# Patient Record
Sex: Male | Born: 1955
Health system: Southern US, Community
[De-identification: ages and names within clinical notes are randomized; demographics above are authoritative.]

## PROBLEM LIST (undated history)

## (undated) ENCOUNTER — Emergency Department (HOSPITAL_COMMUNITY): Payer: 59 | Source: Home / Self Care

## (undated) DIAGNOSIS — M19049 Primary osteoarthritis, unspecified hand: Secondary | ICD-10-CM

## (undated) DIAGNOSIS — N400 Enlarged prostate without lower urinary tract symptoms: Secondary | ICD-10-CM

## (undated) DIAGNOSIS — R361 Hematospermia: Secondary | ICD-10-CM

## (undated) DIAGNOSIS — R42 Dizziness and giddiness: Secondary | ICD-10-CM

## (undated) DIAGNOSIS — Z524 Kidney donor: Secondary | ICD-10-CM

## (undated) DIAGNOSIS — K635 Polyp of colon: Secondary | ICD-10-CM

## (undated) DIAGNOSIS — K219 Gastro-esophageal reflux disease without esophagitis: Secondary | ICD-10-CM

## (undated) DIAGNOSIS — J302 Other seasonal allergic rhinitis: Secondary | ICD-10-CM

## (undated) DIAGNOSIS — E7800 Pure hypercholesterolemia, unspecified: Secondary | ICD-10-CM

## (undated) DIAGNOSIS — I10 Essential (primary) hypertension: Secondary | ICD-10-CM

## (undated) DIAGNOSIS — N529 Male erectile dysfunction, unspecified: Secondary | ICD-10-CM

## (undated) HISTORY — DX: Benign prostatic hyperplasia without lower urinary tract symptoms: N40.0

## (undated) HISTORY — DX: Primary osteoarthritis, unspecified hand: M19.049

## (undated) HISTORY — DX: Other seasonal allergic rhinitis: J30.2

## (undated) HISTORY — DX: Hematospermia: R36.1

## (undated) HISTORY — DX: Gastro-esophageal reflux disease without esophagitis: K21.9

## (undated) HISTORY — PX: ROTATOR CUFF REPAIR: SHX139

## (undated) HISTORY — PX: OTHER SURGICAL HISTORY: SHX169

## (undated) HISTORY — PX: KIDNEY DONATION: SHX685

## (undated) HISTORY — DX: Male erectile dysfunction, unspecified: N52.9

## (undated) HISTORY — DX: Essential (primary) hypertension: I10

## (undated) HISTORY — DX: Polyp of colon: K63.5

---

## 1998-05-15 ENCOUNTER — Ambulatory Visit (HOSPITAL_COMMUNITY): Admission: RE | Admit: 1998-05-15 | Discharge: 1998-05-15 | Payer: Self-pay | Admitting: Gastroenterology

## 1998-08-23 ENCOUNTER — Emergency Department (HOSPITAL_COMMUNITY): Admission: EM | Admit: 1998-08-23 | Discharge: 1998-08-23 | Payer: Self-pay | Admitting: Emergency Medicine

## 1998-08-23 ENCOUNTER — Encounter: Payer: Self-pay | Admitting: Emergency Medicine

## 1999-01-06 ENCOUNTER — Emergency Department (HOSPITAL_COMMUNITY): Admission: EM | Admit: 1999-01-06 | Discharge: 1999-01-06 | Payer: Self-pay | Admitting: Emergency Medicine

## 1999-07-09 ENCOUNTER — Ambulatory Visit (HOSPITAL_COMMUNITY): Admission: RE | Admit: 1999-07-09 | Discharge: 1999-07-09 | Payer: Self-pay | Admitting: Gastroenterology

## 1999-07-09 ENCOUNTER — Encounter (INDEPENDENT_AMBULATORY_CARE_PROVIDER_SITE_OTHER): Payer: Self-pay | Admitting: Specialist

## 2003-12-13 ENCOUNTER — Ambulatory Visit (HOSPITAL_BASED_OUTPATIENT_CLINIC_OR_DEPARTMENT_OTHER): Admission: RE | Admit: 2003-12-13 | Discharge: 2003-12-13 | Payer: Self-pay | Admitting: Emergency Medicine

## 2005-05-04 ENCOUNTER — Encounter: Admission: RE | Admit: 2005-05-04 | Discharge: 2005-05-04 | Payer: Self-pay | Admitting: Emergency Medicine

## 2005-05-07 ENCOUNTER — Encounter: Admission: RE | Admit: 2005-05-07 | Discharge: 2005-05-07 | Payer: Self-pay | Admitting: Emergency Medicine

## 2009-04-17 ENCOUNTER — Encounter: Admission: RE | Admit: 2009-04-17 | Discharge: 2009-04-17 | Payer: Self-pay | Admitting: Occupational Medicine

## 2009-06-18 ENCOUNTER — Ambulatory Visit (HOSPITAL_BASED_OUTPATIENT_CLINIC_OR_DEPARTMENT_OTHER): Admission: RE | Admit: 2009-06-18 | Discharge: 2009-06-18 | Payer: Self-pay | Admitting: Orthopedic Surgery

## 2010-09-30 ENCOUNTER — Other Ambulatory Visit: Payer: Self-pay | Admitting: Gastroenterology

## 2010-10-15 ENCOUNTER — Ambulatory Visit
Admission: RE | Admit: 2010-10-15 | Discharge: 2010-10-15 | Disposition: A | Discharge status: Home / Self Care | Payer: 59 | Source: Ambulatory Visit | Attending: Gastroenterology | Admitting: Gastroenterology

## 2010-11-27 LAB — POCT HEMOGLOBIN-HEMACUE: Hemoglobin: 15.1 g/dL (ref 13.0–17.0)

## 2012-08-09 ENCOUNTER — Ambulatory Visit
Admission: RE | Admit: 2012-08-09 | Discharge: 2012-08-09 | Disposition: A | Discharge status: Home / Self Care | Payer: No Typology Code available for payment source | Source: Ambulatory Visit | Attending: Occupational Medicine | Admitting: Occupational Medicine

## 2012-08-09 ENCOUNTER — Other Ambulatory Visit: Payer: Self-pay | Admitting: Occupational Medicine

## 2012-08-09 DIAGNOSIS — Z0000 Encounter for general adult medical examination without abnormal findings: Secondary | ICD-10-CM

## 2013-07-28 ENCOUNTER — Encounter (HOSPITAL_COMMUNITY): Payer: Self-pay | Admitting: Emergency Medicine

## 2013-07-28 ENCOUNTER — Emergency Department (HOSPITAL_COMMUNITY)
Admission: EM | Admit: 2013-07-28 | Discharge: 2013-07-28 | Disposition: A | Discharge status: Home / Self Care | Payer: 59 | Attending: Emergency Medicine | Admitting: Emergency Medicine

## 2013-07-28 ENCOUNTER — Emergency Department (HOSPITAL_COMMUNITY): Payer: 59

## 2013-07-28 DIAGNOSIS — Z8719 Personal history of other diseases of the digestive system: Secondary | ICD-10-CM | POA: Insufficient documentation

## 2013-07-28 DIAGNOSIS — Z862 Personal history of diseases of the blood and blood-forming organs and certain disorders involving the immune mechanism: Secondary | ICD-10-CM | POA: Insufficient documentation

## 2013-07-28 DIAGNOSIS — R079 Chest pain, unspecified: Secondary | ICD-10-CM

## 2013-07-28 DIAGNOSIS — R0789 Other chest pain: Secondary | ICD-10-CM | POA: Insufficient documentation

## 2013-07-28 DIAGNOSIS — Z8639 Personal history of other endocrine, nutritional and metabolic disease: Secondary | ICD-10-CM | POA: Insufficient documentation

## 2013-07-28 HISTORY — DX: Pure hypercholesterolemia, unspecified: E78.00

## 2013-07-28 HISTORY — DX: Gastro-esophageal reflux disease without esophagitis: K21.9

## 2013-07-28 LAB — BASIC METABOLIC PANEL
Chloride: 99 mEq/L (ref 96–112)
Creatinine, Ser: 1.65 mg/dL — ABNORMAL HIGH (ref 0.50–1.35)
GFR calc Af Amer: 52 mL/min — ABNORMAL LOW (ref >90)
Potassium: 4.1 mEq/L (ref 3.5–5.1)

## 2013-07-28 LAB — CBC
Platelets: 227 10*3/uL (ref 150–400)
RDW: 13.7 % (ref 11.5–15.5)
WBC: 6.9 10*3/uL (ref 4.0–10.5)

## 2013-07-28 LAB — POCT I-STAT TROPONIN I
Troponin i, poc: 0.01 ng/mL (ref 0.00–0.08)
Troponin i, poc: 0.01 ng/mL (ref 0.00–0.08)

## 2013-07-28 NOTE — ED Notes (Signed)
Pt comfortable with d/c and f/u instructions. No Prescriptions. 

## 2013-07-28 NOTE — ED Provider Notes (Signed)
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 He reports episode lasted for approximately 2 minutes and then spontaneously resolved. He no radiation of pain. No nausea, dizziness, diaphoresis. He states he went to the gym today and again in tensor get training for her one hour and had no chest pain or shortness of breath during this period he reports that he's been working out 5 days a week for the past 15 years without symptoms. He recently retired from the [employer]. 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 Denies a history of diabetes, hypertension, tobacco use or family history of premature CAD. No history of PE or DVT. No history of lower extremity swelling or pain. No recent prolonged immobilization such as hospitalization or long flights, surgery, trauma, fracture. No recent fever or cough. He states he is now feeling "great".  Primary care physician is at [facility]  ROS: See HPI Constitutional: no fever  Eyes: no drainage  ENT: no runny nose   Cardiovascular:  chest pain  Resp: SOB  GI: no vomiting GU: no dysuria Integumentary: no rash  Allergy: no hives  Musculoskeletal: no leg swelling  Neurological: no slurred speech ROS otherwise negative  PAST MEDICAL HISTORY/PAST SURGICAL HISTORY:  Past Medical History  Diagnosis Date  . Acid reflux   . High cholesterol     MEDICATIONS:  Prior to Admission medications   Not on File    ALLERGIES:  No Known Allergies  SOCIAL HISTORY:  History  Substance Use Topics  . Smoking status: Never Smoker   . Smokeless tobacco: Not on file  .  Alcohol Use: Yes    FAMILY HISTORY: History reviewed. No pertinent family history.  EXAM: BP 160/79  Pulse 57  Temp(Src) 97.5 F (36.4 C) (Oral)  Resp 18  Ht 5\' 10"  (1.778 m)  Wt 198 lb (89.812 kg)  BMI 28.41 kg/m2  SpO2 100% CONSTITUTIONAL: Alert and oriented and responds appropriately to questions. Well-appearing; well-nourished HEAD: Normocephalic EYES: Conjunctivae clear, PERRL ENT: normal nose; no rhinorrhea; moist mucous membranes; pharynx without lesions noted NECK: Supple, no meningismus, no LAD  CARD: RRR; S1 and S2 appreciated; no murmurs, no clicks, no rubs, no gallops RESP: Normal chest excursion without splinting or tachypnea; breath sounds clear and equal bilaterally; no wheezes, no rhonchi, no rales, chest wall is nontender to palpation ABD/GI: Normal bowel sounds; non-distended; soft, non-tender, no rebound, no guarding BACK:  The back appears normal and is non-tender to palpation, there is no CVA tenderness EXT: Normal ROM in all joints; non-tender to palpation; no edema; normal capillary refill; no cyanosis    SKIN: Normal color for age and race; warm NEURO: Moves all extremities equally PSYCH: The patient's mood and manner are appropriate. Grooming and personal hygiene are appropriate.  MEDICAL DECISION MAKING: Patient here with an episode of chest tightness and shortness of breath that started while at rest. He reports that he was getting repeat when his symptoms started. He denies that this feels like his prior episodes of acid reflux. He has never had similar symptoms. Patient is otherwise healthy with minimal risk factors for ACS and is PERC  negative.  His EKG is normal and his first troponin is negative. He states his symptoms today started at 2:30 PM.  We'll repeat second troponin at 6 hours after the onset of symptoms. Chest x-ray is clear. Patient agrees with this plan. Have advised him to followup with his primary care physician next week.  ED PROGRESS:  Patient's repeat troponin greater than 6 hours after the onset of symptoms is negative. He still hemodynamically stable and asymptomatic. Will have him followup with his primary care physician next week. Given return precautions. Patient verbalizes understanding is comfortable plan.   EKG Interpretation    Date/Time:  Friday July 28 2013 14:46:30 EST Ventricular Rate:  64 PR Interval:  190 QRS Duration: 84 QT Interval:  422 QTC Calculation: 435 R Axis:   49 Text Interpretation:  Normal sinus rhythm Normal ECG No significant change since last tracing Confirmed by Athea Haley  DO, Kennethia Lynes ([id number]) on 07/28/2013 3:24:55 PM             Layla Maw Ndeye Tenorio, DO 07/28/13 2133

## 2013-07-28 NOTE — ED Notes (Signed)
Pt states he went to gym, worked out, left, cooled down, went and got a haircut. Went home, sat down to eat, got tightness in chest, felt SOB. Episode lasted a couple of minutes. Pt denies currently feeling chest pain or SOB. Was concerned due to never having this happen before, states he has a hiatal hernia and pain was different from that. Denies nausea, lightheaded, sweaty.

## 2013-07-28 NOTE — ED Notes (Signed)
Per pt sts that he went home after working out and started having chest tightness and SOB. sts no pain now but he just doesn't feel right.

## 2013-07-28 NOTE — ED Notes (Signed)
The pt has had some chest tightness and some sob whenever he was getting ready for lunch approx 1330.  He had been to the gym earlier  But had no chest tightness then.  At present no chest tightness no sob.  No previous history.  No leg pain no car or plane trips.  Alert oriented skin warm and dry no distress

## 2015-03-04 ENCOUNTER — Ambulatory Visit (INDEPENDENT_AMBULATORY_CARE_PROVIDER_SITE_OTHER): Payer: 59

## 2015-03-04 ENCOUNTER — Encounter: Payer: Self-pay | Admitting: Podiatry

## 2015-03-04 ENCOUNTER — Ambulatory Visit (INDEPENDENT_AMBULATORY_CARE_PROVIDER_SITE_OTHER): Payer: 59 | Admitting: Podiatry

## 2015-03-04 VITALS — BP 126/74 | HR 55 | Resp 17

## 2015-03-04 DIAGNOSIS — S99922A Unspecified injury of left foot, initial encounter: Secondary | ICD-10-CM

## 2015-03-04 DIAGNOSIS — M779 Enthesopathy, unspecified: Secondary | ICD-10-CM

## 2015-03-04 DIAGNOSIS — S8992XA Unspecified injury of left lower leg, initial encounter: Secondary | ICD-10-CM

## 2015-03-04 MED ORDER — METHYLPREDNISOLONE 4 MG PO TBPK: ORAL_TABLET | ORAL | Status: DC

## 2015-03-04 NOTE — Progress Notes (Signed)
   Subjective:    Patient ID: [employer]OL [employer], male    DOB: 1956-02-05, 59 y.o.   MRN: [id number]  HPI 59 year old male presents the office as concerns her left foot pain on the ball the foot. He states that he was running on the beach last week about 2 miles a day. After running the beach started to have pain to his foot. Prior to this he was running however on hard surfaces. He states that over the weekend he was having a lot of pain to his foot and he is having difficulty been pressure down. He points to the sulcus of the second toe just distal to the MTPJ with the majority of his pain is localized. He also states he has pain returned been the second toe upwards. He said no prior treatment. He denies any specific injury or trauma. No other complaints at this time.   Review of Systems  All other systems reviewed and are negative.      Objective:   Physical Exam AAO x3, NAD DP/PT pulses palpable bilaterally, CRT less than 3 seconds Protective sensation intact with Simms Weinstein monofilament, vibratory sensation intact, Achilles tendon reflex intact There is tender to palpation along the sulcus of the left second digit just distal to the MTPJ. There is localized edema to this area as well. There is pain upon dorsal subluxation of the second MTPJ hallux does not appear to be any gross subluxation or dislocation compared to the contralateral extremity at this time. This is difficult to evaluate this time due to pain. There is no area pinpoint tenderness along the metatarsals or to the digit. There is no other areas of tenderness to bilateral lower x-rays. No other areas of edema, erythema, increase in warmth. MMT 5/5, ROM WNL.  No open lesions or pre-ulcerative lesions.  No pain with calf compression, swelling, warmth, erythema bilaterally.      Assessment & Plan:  59 year old male left foot pain, likely second plantar plate injury. -X-rays were obtained and reviewed with the patient. No  definitive evidence of fracture or stress fracture at this time.  -Treatment options discussed including all alternatives, risks, and complications -Discussed likely etiology of his symptoms. -Showed the patient had tape the toe down. -Dispensed offloading pads. -Prescribed Medrol Dosepak. Discussed side effects the medication directed. If any are to occur. He states he has had this medication before without any problems. -If this does not seem to help bleeding symptoms will place and surgical shoe. -Follow-up 3 weeks or sooner if any problems arise. In the meantime, encouraged to call the office with any questions, concerns, change in symptoms.   Celesta Gentile, DPM

## 2015-03-05 ENCOUNTER — Encounter: Payer: Self-pay | Admitting: Podiatry

## 2015-03-05 DIAGNOSIS — S99929A Unspecified injury of unspecified foot, initial encounter: Secondary | ICD-10-CM | POA: Insufficient documentation

## 2015-03-25 ENCOUNTER — Encounter: Payer: Self-pay | Admitting: Podiatry

## 2015-03-25 ENCOUNTER — Ambulatory Visit (INDEPENDENT_AMBULATORY_CARE_PROVIDER_SITE_OTHER): Payer: 59 | Admitting: Podiatry

## 2015-03-25 VITALS — BP 140/77 | HR 64 | Resp 12

## 2015-03-25 DIAGNOSIS — S8992XA Unspecified injury of left lower leg, initial encounter: Secondary | ICD-10-CM

## 2015-03-25 DIAGNOSIS — S99922A Unspecified injury of left foot, initial encounter: Secondary | ICD-10-CM

## 2015-03-25 NOTE — Progress Notes (Signed)
Patient ID: Ruben Leonard, male   DOB: 1956-05-03, 59 y.o.   MRN: [id number]  Subjective: 59 year old male presents to the office for follow up evaluation of left foot pain. He states that he is finished his course of Medrol Dosepak he states overall the left foot is doing much better. He does continue to have some slight discomfort with deep pressure directly overlying the area however it is much improved. He does believe that this pain started after wearing a new Nike shoe and he typically wears Reeboks and a stiffer style shoe. He denies any swelling or redness over the area. Denies any acute changes since last appointment and no other complaints at this time.  Objective: AAO 3, NAD Neurovascular status unchanged There is significant improvement tenderness to palpation of the sulcus of left second digit. There is slight discomfort upon deep palpation of the area. There is no overlying edema, erythema, increase in warmth. There is no sign of the discomfort upon dorsal subluxation of the second MTPJ and there is no significant dislocation compared to the contralateral extremity. There is no area pinpoint bony tenderness or pain the vibratory sensation. No open lesions or pre-ulcerative lesions. There is no pain with calf compression, swelling, warmth, erythema.  Assessment: 59 year old male with resolving left foot pain, likely plantar plate injury  Plan: -Treatment options discussed including all alternatives, risks, and complications -Discussed orthotics to help support and offload the synthetic area. Also discussed with him of warts stiff styled shoe in order to help alleviate his symptoms. He states he recently would does wear stiffer shoe when change in shoe gear as when he noticed this pain. Continue metatarsal pad as he states this significantly helped offload the area as well. Discussed custom orthotics. -Follow-up if symptoms not completely resolved in 4 weeks or sooner if any problems  arise. In the meantime, encouraged to call the office with any questions, concerns, change in symptoms.   Celesta Gentile, DPM

## 2015-04-15 ENCOUNTER — Emergency Department (HOSPITAL_COMMUNITY): Payer: 59

## 2015-04-15 ENCOUNTER — Encounter (HOSPITAL_COMMUNITY): Payer: Self-pay | Admitting: *Deleted

## 2015-04-15 ENCOUNTER — Emergency Department (HOSPITAL_COMMUNITY)
Admission: EM | Admit: 2015-04-15 | Discharge: 2015-04-15 | Disposition: A | Discharge status: Home / Self Care | Payer: 59 | Attending: Emergency Medicine | Admitting: Emergency Medicine

## 2015-04-15 DIAGNOSIS — Y9389 Activity, other specified: Secondary | ICD-10-CM | POA: Diagnosis not present

## 2015-04-15 DIAGNOSIS — Y9289 Other specified places as the place of occurrence of the external cause: Secondary | ICD-10-CM | POA: Diagnosis not present

## 2015-04-15 DIAGNOSIS — S0083XA Contusion of other part of head, initial encounter: Secondary | ICD-10-CM | POA: Diagnosis not present

## 2015-04-15 DIAGNOSIS — Z79899 Other long term (current) drug therapy: Secondary | ICD-10-CM | POA: Diagnosis not present

## 2015-04-15 DIAGNOSIS — W500XXA Accidental hit or strike by another person, initial encounter: Secondary | ICD-10-CM | POA: Diagnosis not present

## 2015-04-15 DIAGNOSIS — S0993XA Unspecified injury of face, initial encounter: Secondary | ICD-10-CM | POA: Diagnosis present

## 2015-04-15 DIAGNOSIS — K219 Gastro-esophageal reflux disease without esophagitis: Secondary | ICD-10-CM | POA: Diagnosis not present

## 2015-04-15 DIAGNOSIS — R52 Pain, unspecified: Secondary | ICD-10-CM

## 2015-04-15 DIAGNOSIS — Y998 Other external cause status: Secondary | ICD-10-CM | POA: Diagnosis not present

## 2015-04-15 DIAGNOSIS — T148XXA Other injury of unspecified body region, initial encounter: Secondary | ICD-10-CM

## 2015-04-15 DIAGNOSIS — E782 Mixed hyperlipidemia: Secondary | ICD-10-CM | POA: Diagnosis not present

## 2015-04-15 MED ORDER — TRAMADOL HCL 50 MG PO TABS: 50.0000 mg | ORAL_TABLET | Freq: Four times a day (QID) | ORAL | Status: DC | PRN

## 2015-04-15 NOTE — ED Provider Notes (Signed)
CSN: [id number]     Arrival date & time 04/15/15  1343 History   First MD Initiated Contact with Patient 04/15/15 1600     Chief Complaint  Patient presents with  . Jaw Pain     (Consider location/radiation/quality/duration/timing/severity/associated sxs/prior Treatment) HPI Comments: Pt comes in with jaw pain and dizziness. Dizziness has resolved after lasting intermittently for 2  Hours. Pt reports that he was working out, lost control of the pull down bar and the bar snapped and hit him in his jaw. He almost felt like he was going to be knocked out, and then started feeling dizzy. PT has jaw pain, especially when he moves it laterally. No headache. No nausea, vomiting, visual complains, seizures, altered mental status, loss of consciousness, new weakness, or numbness, no gait instability.  The history is provided by the patient.    Past Medical History  Diagnosis Date  . Acid reflux   . High cholesterol    Past Surgical History  Procedure Laterality Date  . Kidney donation     No family history on file. Social History  Substance Use Topics  . Smoking status: Never Smoker   . Smokeless tobacco: Never Used  . Alcohol Use: 0.0 oz/week    0 Standard drinks or equivalent per week    Review of Systems  Eyes: Negative for visual disturbance.  Respiratory: Negative for cough.   Genitourinary: Negative for dysuria, enuresis and difficulty urinating.  Musculoskeletal: Positive for arthralgias. Negative for neck pain and neck stiffness.  Neurological: Positive for dizziness. Negative for light-headedness and headaches.  Hematological: Does not bruise/bleed easily.  Psychiatric/Behavioral: Negative for confusion.      Allergies  Review of patient's allergies indicates no known allergies.  Home Medications   Prior to Admission medications   Medication Sig Start Date End Date Taking? Authorizing Provider  atorvastatin (LIPITOR) 20 MG tablet Take 20 mg by mouth daily.     Historical Provider, MD  CIALIS 20 MG tablet Take 20 mg by mouth as needed. 01/29/15   Historical Provider, MD  omeprazole (PRILOSEC) 20 MG capsule Take 20 mg by mouth daily.    Historical Provider, MD  traMADol (ULTRAM) 50 MG tablet Take 1 tablet (50 mg total) by mouth every 6 (six) hours as needed for severe pain. 04/15/15   Nancie Bocanegra Kathrynn Humble, MD   BP 143/75 mmHg  Pulse 62  Temp(Src) 98.7 F (37.1 C) (Oral)  Resp 20  SpO2 96% Physical Exam  Constitutional: He is oriented to person, place, and time. He appears well-developed.  HENT:  Head: Atraumatic.  Stable mandible, no trismus  Neck: Neck supple.  Cardiovascular: Normal rate.   Pulmonary/Chest: Effort normal.  Neurological: He is alert and oriented to person, place, and time. No cranial nerve deficit. Coordination normal.  Skin: Skin is warm.  Nursing note and vitals reviewed.   ED Course  Procedures (including critical care time) Labs Review Labs Reviewed - No data to display  Imaging Review Dg Orthopantogram  04/15/2015   CLINICAL DATA:  Patient was hit in the chin with a barbell. Complains of bilateral temporomandibular joint pain  EXAM: ORTHOPANTOGRAM/PANORAMIC  COMPARISON:  None.  FINDINGS: Bilateral temporomandibular joints are normally located without evidence of osteoarthritic changes. There is no evidence of osseous lesions or fractures. Normal dentition is seen. There are unerupted horizontally oriented within the bone mandibular wisdom teeth bilaterally.  IMPRESSION: No evidence of fracture or dislocation of the temporomandibular joints.  Unerupted impacted mandibular wisdom teeth.   Electronically  Signed   By: Fidela Salisbury M.D.   On: 04/15/2015 14:55   I have personally reviewed and evaluated these images and lab results as part of my medical decision-making.   EKG Interpretation None      MDM   Final diagnoses:  Contusion    Pt with blunt trauma to the chin and resultant jaw pain. Xrays neg for  fracture. No dizziness, no headaches - doubt posterior circulation injury or concussion. Soft diet and rice tx advised.   Varney Biles, MD 04/15/15 [phone]

## 2015-04-15 NOTE — Discharge Instructions (Signed)
Xrays are normal. Dizziness has resolved - which is good. Try soft diet for the next 1-2 days. Expect pain to get worse later. Return to the  ER if you have sever dizziness, confusion.   Contusion A contusion is a deep bruise. Contusions are the result of an injury that caused bleeding under the skin. The contusion may turn blue, purple, or yellow. Minor injuries will give you a painless contusion, but more severe contusions may stay painful and swollen for a few weeks.  CAUSES  A contusion is usually caused by a blow, trauma, or direct force to an area of the body. SYMPTOMS   Swelling and redness of the injured area.  Bruising of the injured area.  Tenderness and soreness of the injured area.  Pain. DIAGNOSIS  The diagnosis can be made by taking a history and physical exam. An X-ray, CT scan, or MRI may be needed to determine if there were any associated injuries, such as fractures. TREATMENT  Specific treatment will depend on what area of the body was injured. In general, the best treatment for a contusion is resting, icing, elevating, and applying cold compresses to the injured area. Over-the-counter medicines may also be recommended for pain control. Ask your caregiver what the best treatment is for your contusion. HOME CARE INSTRUCTIONS   Put ice on the injured area.  Put ice in a plastic bag.  Place a towel between your skin and the bag.  Leave the ice on for 15-20 minutes, 3-4 times a day, or as directed by your health care provider.  Only take over-the-counter or prescription medicines for pain, discomfort, or fever as directed by your caregiver. Your caregiver may recommend avoiding anti-inflammatory medicines (aspirin, ibuprofen, and naproxen) for 48 hours because these medicines may increase bruising.  Rest the injured area.  If possible, elevate the injured area to reduce swelling. SEEK IMMEDIATE MEDICAL CARE IF:   You have increased bruising or swelling.  You  have pain that is getting worse.  Your swelling or pain is not relieved with medicines. MAKE SURE YOU:   Understand these instructions.  Will watch your condition.  Will get help right away if you are not doing well or get worse. Document Released: 05/20/2005 Document Revised: 08/15/2013 Document Reviewed: 06/15/2011 [facility] Patient Information 2015 [place], [place]. This information is not intended to replace advice given to you by your health care provider. Make sure you discuss any questions you have with your health care provider.

## 2015-04-15 NOTE — ED Notes (Signed)
Patient reports he was working out.  He states the weights slipped from his hands and hit him under his chin.  States it almost knocked him out.  Patient has some pain when movement of jaw. Patient states he feels dizzy as well.   Patient denies n/v.  Patient denies neck pain.  Patient took tylenol at 1100.

## 2015-04-15 NOTE — ED Notes (Signed)
Declined W/C at D/C and was escorted to lobby by RN. 

## 2015-04-26 ENCOUNTER — Telehealth: Payer: Self-pay

## 2015-04-26 ENCOUNTER — Other Ambulatory Visit: Payer: Self-pay | Admitting: Podiatry

## 2015-04-26 MED ORDER — TRAMADOL HCL 50 MG PO TABS: 50.0000 mg | ORAL_TABLET | Freq: Four times a day (QID) | ORAL | Status: DC | PRN

## 2015-04-26 NOTE — Telephone Encounter (Signed)
No refill

## 2015-04-26 NOTE — Telephone Encounter (Signed)
Dr. Jacqualyn Posey, can this medication be refilled?  Marcy Siren

## 2015-04-26 NOTE — Telephone Encounter (Signed)
Spoke with pt regarding foot pain. He states he think he re-injured the foot again. Advised him to ice, elevate, and get an appt to be evaluated. In the mean time he can use tramadol to help with the pain as pt states this helped before. Tramadol Rx written and faxed to pharmacy

## 2015-07-26 ENCOUNTER — Ambulatory Visit (INDEPENDENT_AMBULATORY_CARE_PROVIDER_SITE_OTHER): Payer: 59 | Admitting: Podiatry

## 2015-07-26 ENCOUNTER — Encounter: Payer: Self-pay | Admitting: Podiatry

## 2015-07-26 VITALS — BP 137/80 | HR 62 | Resp 12

## 2015-07-26 DIAGNOSIS — B351 Tinea unguium: Secondary | ICD-10-CM | POA: Diagnosis not present

## 2015-07-26 NOTE — Progress Notes (Signed)
   Subjective:    Patient ID: Ruben Leonard, male    DOB: 08-14-1956, 59 y.o.   MRN: [id number]  HPI  59 year old male presents the also concerns a right big toe nail discoloration and thickening which is ongoing for about 2 months and his been progressive in looking worse. He's had no previous treatment. Denies any pain to the area and denies any surrounding redness or drainage. No other complaints at this time.  Review of Systems  Skin: Positive for color change.       Objective:   Physical Exam AAO 3, NAD DP/PT pulses palpable 2/4, CRT less than 3 seconds Protective sensation intact with Derrel Nip monofilament Right hallux toe nail is hypertrophic, dystrophic, discolored , brittle. There is no surrounding edema, erythema, drainage/purulence. Remainder the toenails are somewhat dystrophic and discolored but not as significant as the right hallux. No open lesions or pre-ulcerative lesions. No areas of tenderness bilateral lower extremities. There is no pain with Compression, swelling, warmth, erythema.     Assessment & Plan:   59 year old male with right hallux onychodystrophy, likely onychomycosis -Treatment options discussed including all alternatives, risks, and complications -The nail was biopsied and sent for culture. Discussed treatment options for onychomycosis but we will await the results of the biopsy before proceeding with treatment. Follow-up after nail biopsy or sooner if any problems are to arise. Call with questions or concerns.  Celesta Gentile, DPM

## 2015-07-26 NOTE — Patient Instructions (Signed)

## 2015-08-02 ENCOUNTER — Encounter: Payer: Self-pay | Admitting: Podiatry

## 2015-08-12 ENCOUNTER — Telehealth: Payer: Self-pay | Admitting: *Deleted

## 2015-08-12 NOTE — Telephone Encounter (Signed)
Dr. Jacqualyn Posey reviewed fungal culture results as negative and recommended urea cream-[id number], or Nuvail.  Results to pt and recommendations.

## 2015-08-29 DIAGNOSIS — B351 Tinea unguium: Secondary | ICD-10-CM

## 2015-09-13 ENCOUNTER — Ambulatory Visit: Payer: 59 | Admitting: Podiatry

## 2015-11-07 IMAGING — CR DG CHEST 2V
2 series · 2 of 2 positions shown · non-contrast
Comparison: 08/09/2012

CLINICAL DATA: Chest tightness, shortness of breath

EXAM:
CHEST  2 VIEW

[w chest pa]
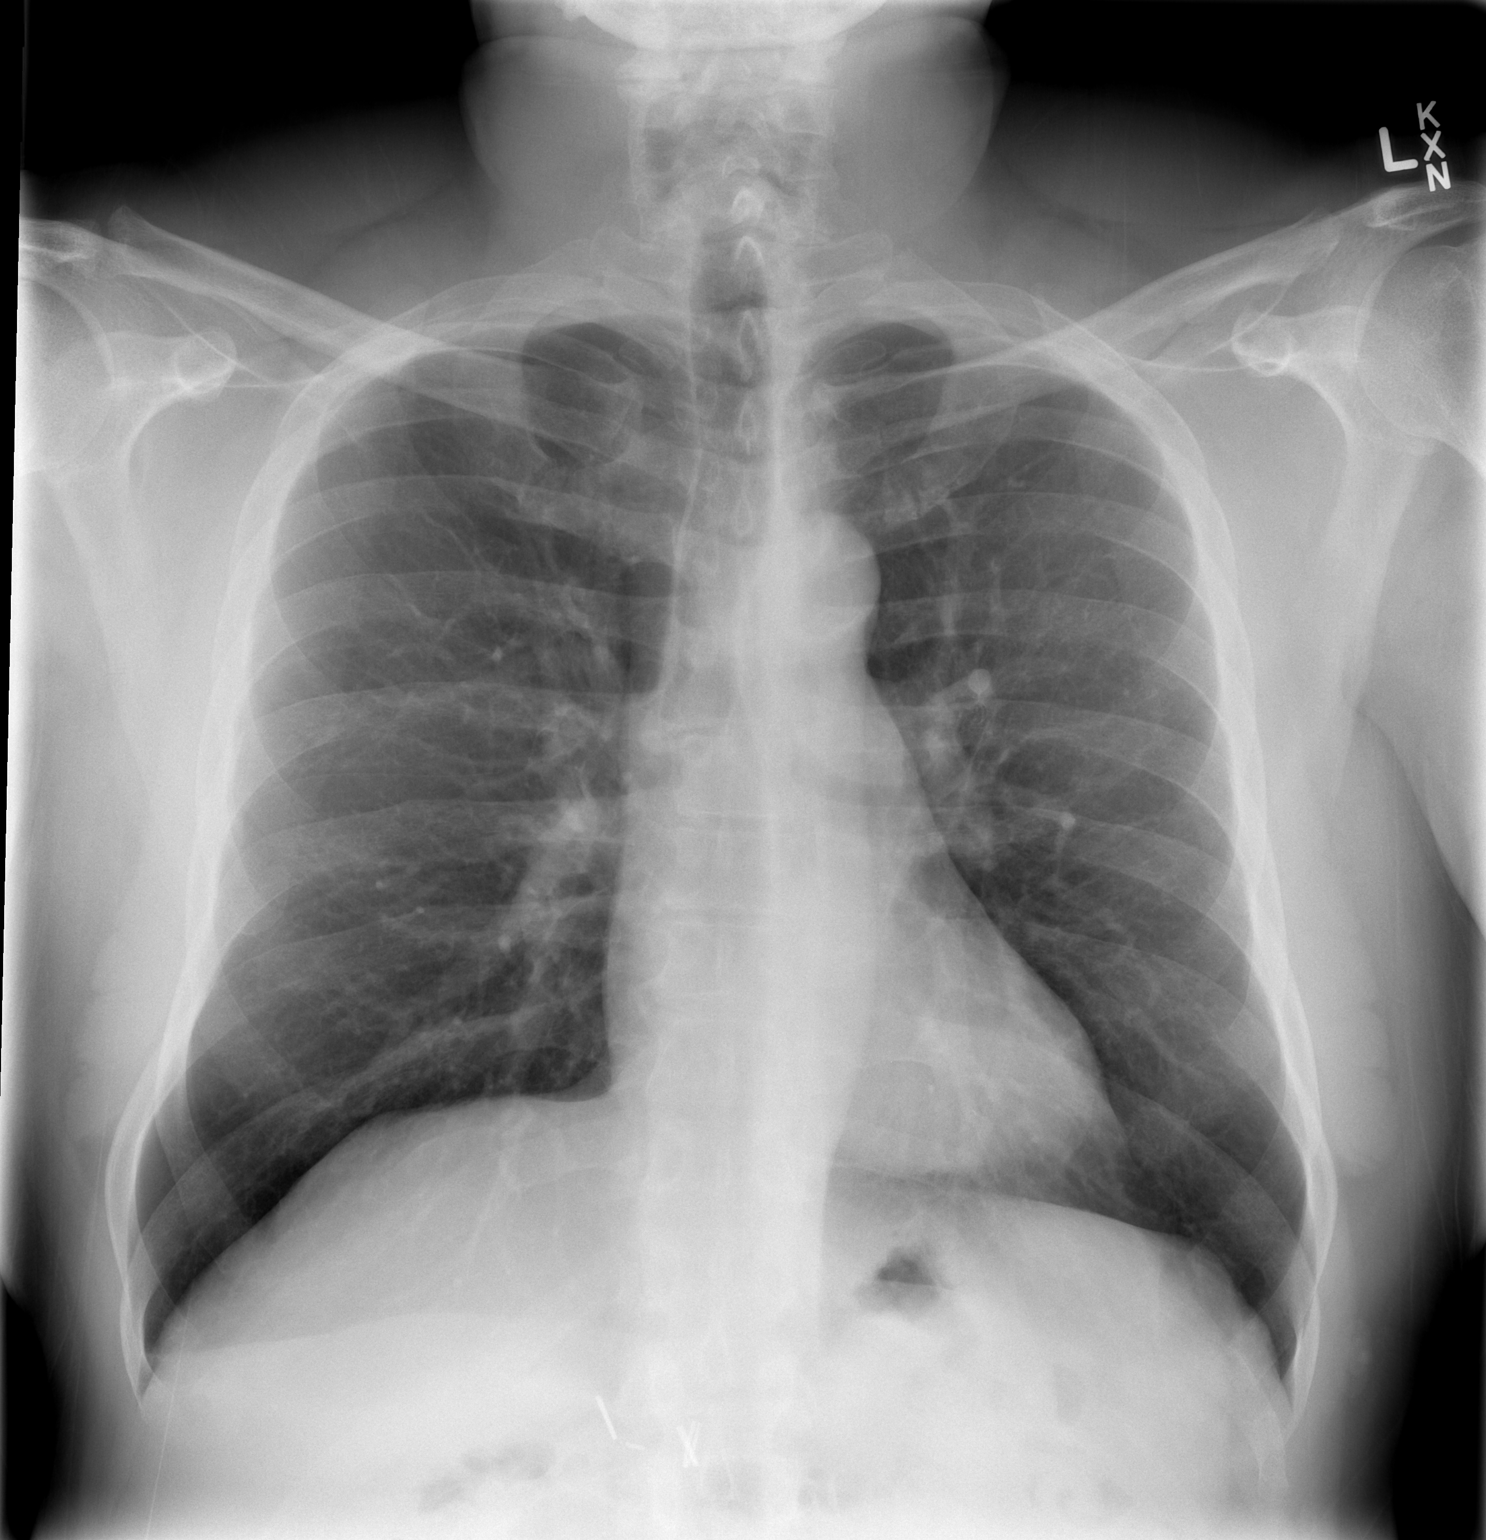

[w chest lat]
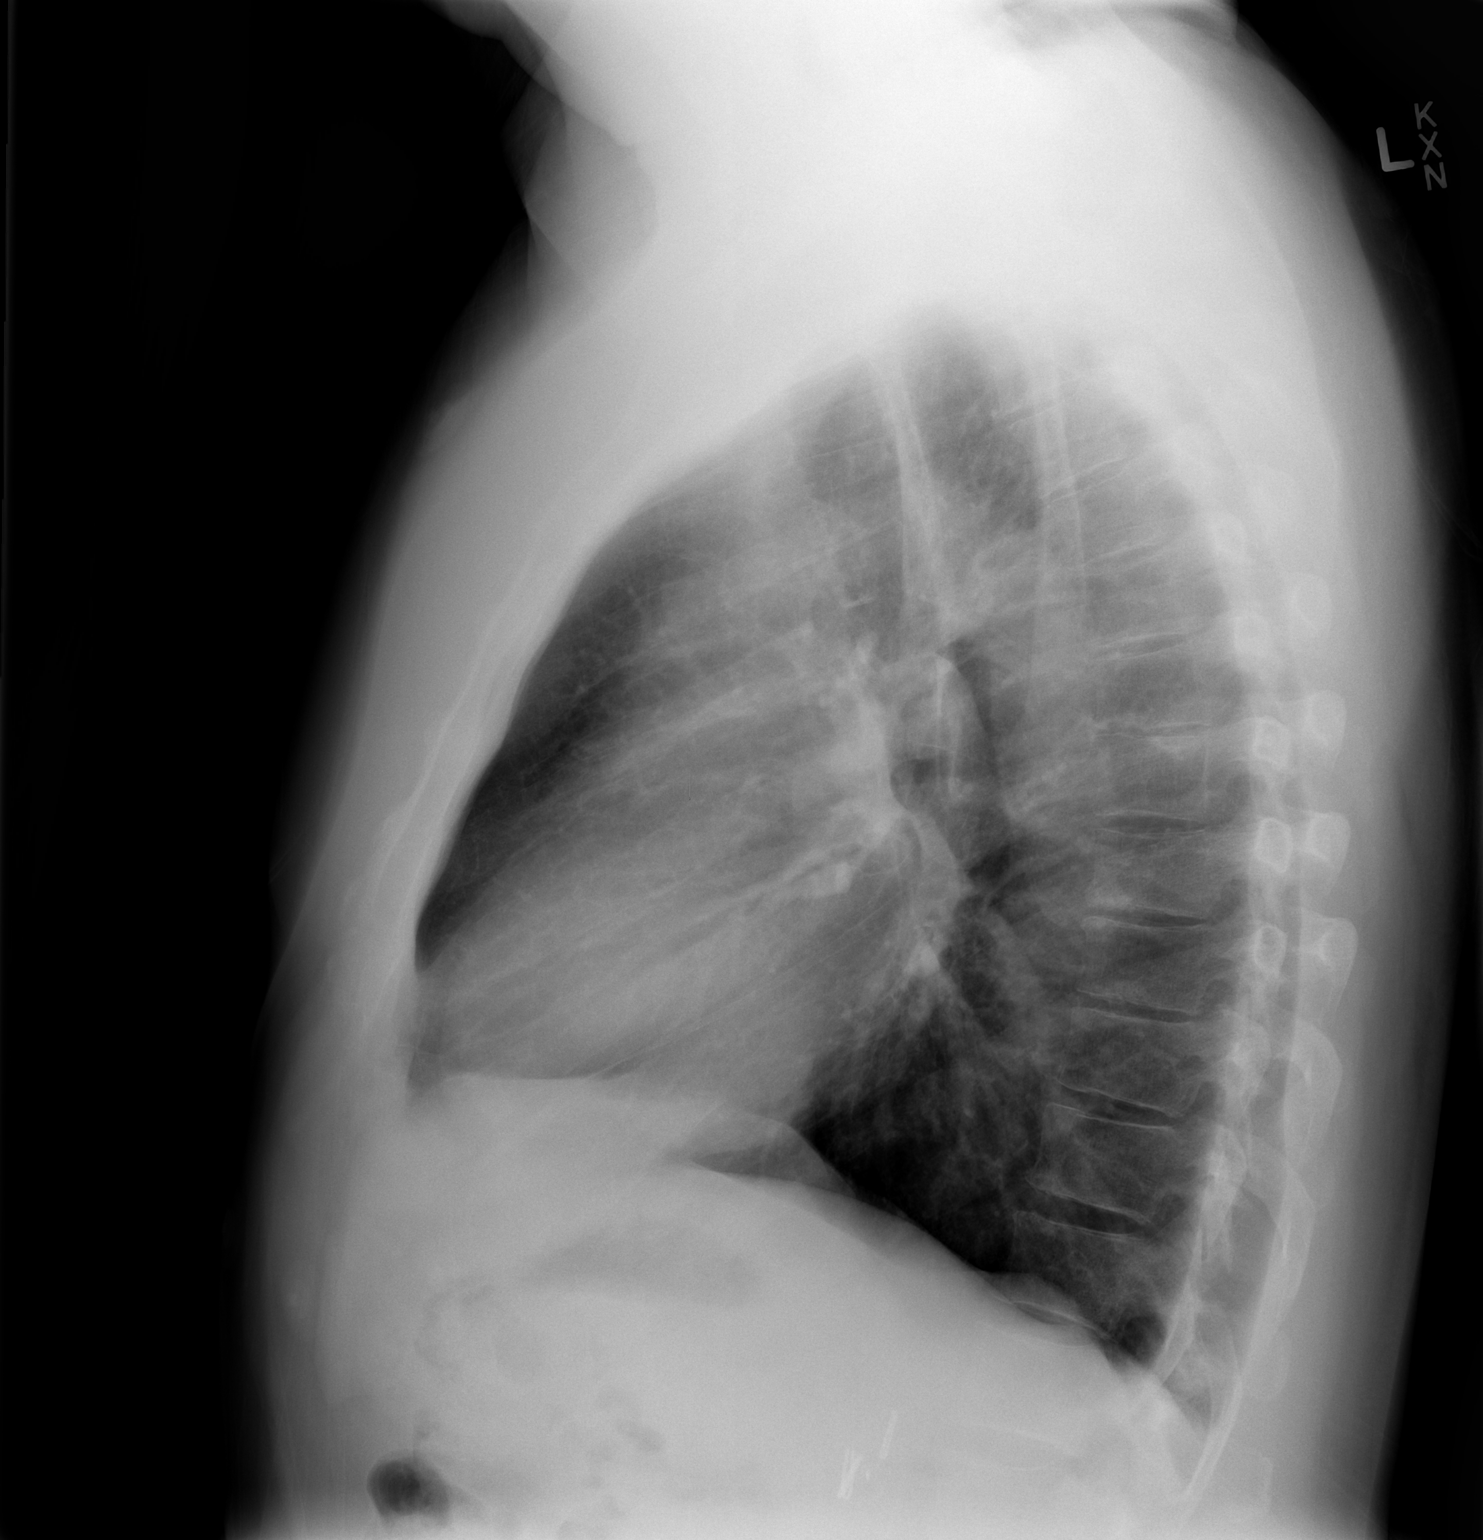

[2 of 2 positions shown; findings below may reference images not displayed]

FINDINGS: The heart size and mediastinal contours are within normal limits.
Both lungs are clear. The visualized skeletal structures are
unremarkable.
IMPRESSION: No active cardiopulmonary disease.

## 2016-07-09 ENCOUNTER — Emergency Department (HOSPITAL_COMMUNITY)
Admission: EM | Admit: 2016-07-09 | Discharge: 2016-07-09 | Disposition: A | Discharge status: Home / Self Care | Payer: 59 | Attending: Emergency Medicine | Admitting: Emergency Medicine

## 2016-07-09 ENCOUNTER — Encounter (HOSPITAL_COMMUNITY): Payer: Self-pay | Admitting: Emergency Medicine

## 2016-07-09 DIAGNOSIS — H8121 Vestibular neuronitis, right ear: Secondary | ICD-10-CM | POA: Insufficient documentation

## 2016-07-09 DIAGNOSIS — R42 Dizziness and giddiness: Secondary | ICD-10-CM

## 2016-07-09 DIAGNOSIS — H8120 Vestibular neuronitis, unspecified ear: Secondary | ICD-10-CM

## 2016-07-09 HISTORY — DX: Dizziness and giddiness: R42

## 2016-07-09 LAB — URINALYSIS, ROUTINE W REFLEX MICROSCOPIC
Bilirubin Urine: NEGATIVE
Glucose, UA: NEGATIVE mg/dL
Hgb urine dipstick: NEGATIVE
KETONES UR: NEGATIVE mg/dL
LEUKOCYTES UA: NEGATIVE
NITRITE: NEGATIVE
PH: 7 (ref 5.0–8.0)
Protein, ur: 30 mg/dL — AB
SPECIFIC GRAVITY, URINE: 1.025 (ref 1.005–1.030)

## 2016-07-09 LAB — CBC
HEMATOCRIT: 46.7 % (ref 39.0–52.0)
HEMOGLOBIN: 15.8 g/dL (ref 13.0–17.0)
MCH: 27.8 pg (ref 26.0–34.0)
MCHC: 33.8 g/dL (ref 30.0–36.0)
MCV: 82.1 fL (ref 78.0–100.0)
Platelets: 214 10*3/uL (ref 150–400)
RBC: 5.69 MIL/uL (ref 4.22–5.81)
RDW: 13.7 % (ref 11.5–15.5)
WBC: 5.6 10*3/uL (ref 4.0–10.5)

## 2016-07-09 LAB — CBG MONITORING, ED: Glucose-Capillary: 107 mg/dL — ABNORMAL HIGH (ref 65–99)

## 2016-07-09 LAB — BASIC METABOLIC PANEL
ANION GAP: 7 (ref 5–15)
BUN: 21 mg/dL — ABNORMAL HIGH (ref 6–20)
CO2: 29 mmol/L (ref 22–32)
Calcium: 9.6 mg/dL (ref 8.9–10.3)
Chloride: 105 mmol/L (ref 101–111)
Creatinine, Ser: 1.57 mg/dL — ABNORMAL HIGH (ref 0.61–1.24)
GFR, EST AFRICAN AMERICAN: 54 mL/min — AB (ref >60)
GFR, EST NON AFRICAN AMERICAN: 47 mL/min — AB (ref >60)
Glucose, Bld: 132 mg/dL — ABNORMAL HIGH (ref 65–99)
POTASSIUM: 4.1 mmol/L (ref 3.5–5.1)
SODIUM: 141 mmol/L (ref 135–145)

## 2016-07-09 LAB — URINE MICROSCOPIC-ADD ON

## 2016-07-09 MED ORDER — MECLIZINE HCL 25 MG PO TABS: 25.0000 mg | ORAL_TABLET | Freq: Three times a day (TID) | ORAL | 0 refills | Status: DC | PRN

## 2016-07-09 MED ORDER — MECLIZINE HCL 25 MG PO TABS
25.0000 mg | ORAL_TABLET | Freq: Once | ORAL | Status: AC
  Administered 2016-07-09: 25 mg via ORAL
  Filled 2016-07-09: qty 1

## 2016-07-09 NOTE — ED Notes (Signed)
Papers and medications reviewed with patient. Verbalizes understanding and intent to follow up

## 2016-07-09 NOTE — ED Provider Notes (Signed)
[facility] DEPT Provider Note   CSN: [id number] Arrival date & time: 07/09/16  [id number]     History   Chief Complaint Chief Complaint  Patient presents with  . Dizziness    HPI SIDDHANTH EVENER is a 60 y.o. male with a past medical history of HLD, BPPV presents the ED today complaining of dizziness. Patient states that upon awakening this morning he felt extremely dizzy, as if the room was spinning around him. Patient states that movement and going from a sitting to a standing position worsened his dizziness. Patient states symptoms lasted from 6 AM until 10:30 AM. He decided to come to the ER for further evaluation. He actually drove himself here. He states that while in the waiting room his symptoms spontaneously resolved. He reports a similar episode 2 weeks ago in which the dizziness was very prolonged and spontaneously resolved several hours later. Patient states at that time he had an upper respiratory infection which has since resolved. He still has ongoing fullness in his right ear. Patient states these last 2 episodes of dizziness are different from his typical vertigo. He states that his typical vertigo occurs when he turns his head too quickly and she "feels like is on a merry-go-round". He denies any change in his vision, vomiting, paresthesias, headache, LOC.   HPI  Past Medical History:  Diagnosis Date  . Acid reflux   . High cholesterol   . Vertigo     Patient Active Problem List   Diagnosis Date Noted  . Plantar plate injury [id number]    Past Surgical History:  Procedure Laterality Date  . KIDNEY DONATION         Home Medications    Prior to Admission medications   Medication Sig Start Date End Date Taking? Authorizing Provider  acetaminophen (TYLENOL) 500 MG tablet Take 1,000 mg by mouth every 6 (six) hours as needed for moderate pain or headache.   Yes Historical Provider, MD  atorvastatin (LIPITOR) 20 MG tablet Take 20 mg by mouth daily.   Yes  Historical Provider, MD  losartan (COZAAR) 50 MG tablet Take 50 mg by mouth daily. 07/08/16  Yes Historical Provider, MD  omeprazole (PRILOSEC) 20 MG capsule Take 20 mg by mouth daily as needed (for acid reflux).    Yes Historical Provider, MD  CIALIS 20 MG tablet Take 20 mg by mouth as needed. 01/29/15   Historical Provider, MD    Family History History reviewed. No pertinent family history.  Social History Social History  Substance Use Topics  . Smoking status: Never Smoker  . Smokeless tobacco: Never Used  . Alcohol use 0.0 oz/week     Comment: occassionally     Allergies   Patient has no known allergies.   Review of Systems Review of Systems  All other systems reviewed and are negative.    Physical Exam Updated Vital Signs BP 137/88 (BP Location: Left Arm)   Pulse (!) 48   Temp 97.6 F (36.4 C) (Oral)   Resp 14   Ht 5\' 10"  (1.778 m)   Wt 90.7 kg   SpO2 96%   BMI 28.70 kg/m   Physical Exam  Constitutional: He is oriented to person, place, and time. He appears well-developed and well-nourished. No distress.  HENT:  Head: Normocephalic and atraumatic.  Mouth/Throat: No oropharyngeal exudate.  Eyes: Conjunctivae and EOM are normal. Pupils are equal, round, and reactive to light. Right eye exhibits no discharge. Left eye exhibits no discharge. No scleral icterus.  Cardiovascular: Normal rate, regular rhythm, normal heart sounds and intact distal pulses.  Exam reveals no gallop and no friction rub.   No murmur heard. Pulmonary/Chest: Effort normal and breath sounds normal. No respiratory distress. He has no wheezes. He has no rales. He exhibits no tenderness.  Abdominal: Soft. He exhibits no distension. There is no tenderness. There is no guarding.  Musculoskeletal: Normal range of motion. He exhibits no edema.  Neurological: He is alert and oriented to person, place, and time. He displays normal reflexes. No cranial nerve deficit. Coordination normal.  Strength 5/5  throughout. No sensory deficits. No gait abnormality. No dysmetria. No slurred speech. No facial droop. Negative pronator drift.    Skin: Skin is warm and dry. No rash noted. He is not diaphoretic. No erythema. No pallor.  Psychiatric: He has a normal mood and affect. His behavior is normal.  Nursing note and vitals reviewed.    ED Treatments / Results  Labs (all labs ordered are listed, but only abnormal results are displayed) Labs Reviewed  BASIC METABOLIC PANEL - Abnormal; Notable for the following:       Result Value   Glucose, Bld 132 (*)    BUN 21 (*)    Creatinine, Ser 1.57 (*)    GFR calc non Af Amer 47 (*)    GFR calc Af Amer 54 (*)    All other components within normal limits  URINALYSIS, ROUTINE W REFLEX MICROSCOPIC (NOT AT [facility]) - Abnormal; Notable for the following:    Protein, ur 30 (*)    All other components within normal limits  URINE MICROSCOPIC-ADD ON - Abnormal; Notable for the following:    Squamous Epithelial / LPF 0-5 (*)    Bacteria, UA RARE (*)    Casts GRANULAR CAST (*)    All other components within normal limits  CBG MONITORING, ED - Abnormal; Notable for the following:    Glucose-Capillary 107 (*)    All other components within normal limits  CBC    EKG  EKG Interpretation  Date/Time:  Thursday July 09 2016 11:56:36 EST Ventricular Rate:  53 PR Interval:    QRS Duration: 90 QT Interval:  440 QTC Calculation: 414 R Axis:   69 Text Interpretation:  Sinus rhythm Borderline prolonged PR interval No significant change since last tracing Confirmed by [facility] MD, PEDRO [id number]) on 07/09/2016 12:10:05 PM       Radiology No results found.  Procedures Procedures (including critical care time)  Medications Ordered in ED Medications  meclizine (ANTIVERT) tablet 25 mg (25 mg Oral Given 07/09/16 1430)     Initial Impression / Assessment and Plan / ED Course  I have reviewed the triage vital signs and the nursing notes.  Pertinent labs  & imaging results that were available during my care of the patient were reviewed by me and considered in my medical decision making (see chart for details).  Clinical Course    60 y.o m with long standing hx of BPPV, followed by ENT presents to the ED today c/o dizziness upon wakening this morning. Pt states dizziness feels different than previous episodes of BPPV but states that it "feels like the room is spinning". Dizziness lasted for 4 hours and has since resolved since being in the ED waiting room. Pt also was able to drive himself to the ED while he states he was symptomatic. Pt currently completely asymptomatic. No neurological deficits on exam. Pt does reports similar episode of dizziness that occurred 2 weeks  ago and also spontaneously resolved. Pt recently recovered from URI. Still reports "fullness sensation" in R ear. Lab work unremarkable. Pt ambulated without difficulty, no orthostasis. Doubt posterior stroke. More likely vestibular neuronitis given recent URI and long standing hx of BPPV. Pt given dose of meclizine in ED, will d/c with same. Recommend follow up with with ENT or PCP. Pt given STRICT return precautions.   Patient was discussed with  Dr. Leonette Monarch who agrees with the treatment plan.   Final Clinical Impressions(s) / ED Diagnoses   Final diagnoses:  Dizziness  Vestibular neuronitis, unspecified laterality    New Prescriptions New Prescriptions   No medications on file     Carlos Levering, PA-C 07/13/16 [place], MD 07/13/16 1732

## 2016-07-09 NOTE — ED Triage Notes (Signed)
Pt states he woke up feeling dizzy this morning and nauseated. Pt has hx of vertigo. Pt states the room feels like it is spinning and he cannot keep his balance.

## 2016-07-09 NOTE — ED Notes (Signed)
Pt ambulated to RR with complaints of slight dizziness and being light headed.

## 2016-07-09 NOTE — Discharge Instructions (Signed)
Take meclizine as needed for dizziness. Follow up with your PCP. Return to the ED immediately if you experience severe worsening of your symptoms, change in vision, weakness in an extremity.

## 2016-09-08 DIAGNOSIS — M7651 Patellar tendinitis, right knee: Secondary | ICD-10-CM | POA: Diagnosis not present

## 2016-09-30 DIAGNOSIS — G4733 Obstructive sleep apnea (adult) (pediatric): Secondary | ICD-10-CM | POA: Diagnosis not present

## 2016-11-11 DIAGNOSIS — Z0000 Encounter for general adult medical examination without abnormal findings: Secondary | ICD-10-CM | POA: Diagnosis not present

## 2016-11-18 DIAGNOSIS — N401 Enlarged prostate with lower urinary tract symptoms: Secondary | ICD-10-CM | POA: Diagnosis not present

## 2016-11-18 DIAGNOSIS — Z1212 Encounter for screening for malignant neoplasm of rectum: Secondary | ICD-10-CM | POA: Diagnosis not present

## 2016-11-18 DIAGNOSIS — I10 Essential (primary) hypertension: Secondary | ICD-10-CM | POA: Diagnosis not present

## 2016-11-18 DIAGNOSIS — Z1389 Encounter for screening for other disorder: Secondary | ICD-10-CM | POA: Diagnosis not present

## 2016-11-18 DIAGNOSIS — Z0000 Encounter for general adult medical examination without abnormal findings: Secondary | ICD-10-CM | POA: Diagnosis not present

## 2017-01-13 DIAGNOSIS — M7651 Patellar tendinitis, right knee: Secondary | ICD-10-CM | POA: Diagnosis not present

## 2017-02-03 DIAGNOSIS — M7651 Patellar tendinitis, right knee: Secondary | ICD-10-CM | POA: Diagnosis not present

## 2017-04-28 DIAGNOSIS — I129 Hypertensive chronic kidney disease with stage 1 through stage 4 chronic kidney disease, or unspecified chronic kidney disease: Secondary | ICD-10-CM | POA: Diagnosis not present

## 2017-04-28 DIAGNOSIS — N183 Chronic kidney disease, stage 3 (moderate): Secondary | ICD-10-CM | POA: Diagnosis not present

## 2017-04-28 DIAGNOSIS — Z23 Encounter for immunization: Secondary | ICD-10-CM | POA: Diagnosis not present

## 2017-04-28 DIAGNOSIS — E785 Hyperlipidemia, unspecified: Secondary | ICD-10-CM | POA: Diagnosis not present

## 2017-05-17 DIAGNOSIS — G4733 Obstructive sleep apnea (adult) (pediatric): Secondary | ICD-10-CM | POA: Diagnosis not present

## 2017-06-15 DIAGNOSIS — I10 Essential (primary) hypertension: Secondary | ICD-10-CM | POA: Diagnosis not present

## 2017-07-22 DIAGNOSIS — N400 Enlarged prostate without lower urinary tract symptoms: Secondary | ICD-10-CM | POA: Diagnosis not present

## 2017-08-13 DIAGNOSIS — R55 Syncope and collapse: Secondary | ICD-10-CM | POA: Diagnosis not present

## 2017-08-13 DIAGNOSIS — J069 Acute upper respiratory infection, unspecified: Secondary | ICD-10-CM | POA: Diagnosis not present

## 2017-08-13 DIAGNOSIS — R070 Pain in throat: Secondary | ICD-10-CM | POA: Diagnosis not present

## 2017-11-12 DIAGNOSIS — Z125 Encounter for screening for malignant neoplasm of prostate: Secondary | ICD-10-CM | POA: Diagnosis not present

## 2017-11-12 DIAGNOSIS — R82998 Other abnormal findings in urine: Secondary | ICD-10-CM | POA: Diagnosis not present

## 2017-11-12 DIAGNOSIS — Z0000 Encounter for general adult medical examination without abnormal findings: Secondary | ICD-10-CM | POA: Diagnosis not present

## 2017-11-19 DIAGNOSIS — E7800 Pure hypercholesterolemia, unspecified: Secondary | ICD-10-CM | POA: Diagnosis not present

## 2017-11-19 DIAGNOSIS — Z1389 Encounter for screening for other disorder: Secondary | ICD-10-CM | POA: Diagnosis not present

## 2017-11-19 DIAGNOSIS — Z905 Acquired absence of kidney: Secondary | ICD-10-CM | POA: Diagnosis not present

## 2017-11-19 DIAGNOSIS — G4733 Obstructive sleep apnea (adult) (pediatric): Secondary | ICD-10-CM | POA: Diagnosis not present

## 2017-11-19 DIAGNOSIS — Z0000 Encounter for general adult medical examination without abnormal findings: Secondary | ICD-10-CM | POA: Diagnosis not present

## 2017-12-08 DIAGNOSIS — M7651 Patellar tendinitis, right knee: Secondary | ICD-10-CM | POA: Diagnosis not present

## 2018-01-03 DIAGNOSIS — G4733 Obstructive sleep apnea (adult) (pediatric): Secondary | ICD-10-CM | POA: Diagnosis not present

## 2018-01-13 DIAGNOSIS — L03119 Cellulitis of unspecified part of limb: Secondary | ICD-10-CM | POA: Diagnosis not present

## 2018-01-13 DIAGNOSIS — L550 Sunburn of first degree: Secondary | ICD-10-CM | POA: Diagnosis not present

## 2018-01-26 DIAGNOSIS — M7651 Patellar tendinitis, right knee: Secondary | ICD-10-CM | POA: Diagnosis not present

## 2018-04-11 DIAGNOSIS — Z23 Encounter for immunization: Secondary | ICD-10-CM | POA: Diagnosis not present

## 2018-04-21 DIAGNOSIS — I129 Hypertensive chronic kidney disease with stage 1 through stage 4 chronic kidney disease, or unspecified chronic kidney disease: Secondary | ICD-10-CM | POA: Diagnosis not present

## 2018-04-21 DIAGNOSIS — Z524 Kidney donor: Secondary | ICD-10-CM | POA: Diagnosis not present

## 2018-04-21 DIAGNOSIS — N183 Chronic kidney disease, stage 3 (moderate): Secondary | ICD-10-CM | POA: Diagnosis not present

## 2018-07-07 DIAGNOSIS — G4733 Obstructive sleep apnea (adult) (pediatric): Secondary | ICD-10-CM | POA: Diagnosis not present

## 2018-07-29 DIAGNOSIS — R05 Cough: Secondary | ICD-10-CM | POA: Diagnosis not present

## 2018-08-05 DIAGNOSIS — R1032 Left lower quadrant pain: Secondary | ICD-10-CM | POA: Diagnosis not present

## 2018-08-05 DIAGNOSIS — K219 Gastro-esophageal reflux disease without esophagitis: Secondary | ICD-10-CM | POA: Diagnosis not present

## 2018-09-27 DIAGNOSIS — K449 Diaphragmatic hernia without obstruction or gangrene: Secondary | ICD-10-CM | POA: Diagnosis not present

## 2018-09-27 DIAGNOSIS — K317 Polyp of stomach and duodenum: Secondary | ICD-10-CM | POA: Diagnosis not present

## 2018-09-27 DIAGNOSIS — R1012 Left upper quadrant pain: Secondary | ICD-10-CM | POA: Diagnosis not present

## 2018-09-27 DIAGNOSIS — K219 Gastro-esophageal reflux disease without esophagitis: Secondary | ICD-10-CM | POA: Diagnosis not present

## 2018-09-27 DIAGNOSIS — D131 Benign neoplasm of stomach: Secondary | ICD-10-CM | POA: Diagnosis not present

## 2018-11-18 DIAGNOSIS — Z0000 Encounter for general adult medical examination without abnormal findings: Secondary | ICD-10-CM | POA: Diagnosis not present

## 2018-11-18 DIAGNOSIS — I10 Essential (primary) hypertension: Secondary | ICD-10-CM | POA: Diagnosis not present

## 2018-11-18 DIAGNOSIS — R82998 Other abnormal findings in urine: Secondary | ICD-10-CM | POA: Diagnosis not present

## 2018-11-25 DIAGNOSIS — G4733 Obstructive sleep apnea (adult) (pediatric): Secondary | ICD-10-CM | POA: Diagnosis not present

## 2018-11-25 DIAGNOSIS — Z0000 Encounter for general adult medical examination without abnormal findings: Secondary | ICD-10-CM | POA: Diagnosis not present

## 2018-11-25 DIAGNOSIS — I129 Hypertensive chronic kidney disease with stage 1 through stage 4 chronic kidney disease, or unspecified chronic kidney disease: Secondary | ICD-10-CM | POA: Diagnosis not present

## 2018-11-25 DIAGNOSIS — E7800 Pure hypercholesterolemia, unspecified: Secondary | ICD-10-CM | POA: Diagnosis not present

## 2018-12-08 DIAGNOSIS — L219 Seborrheic dermatitis, unspecified: Secondary | ICD-10-CM | POA: Diagnosis not present

## 2018-12-08 DIAGNOSIS — L280 Lichen simplex chronicus: Secondary | ICD-10-CM | POA: Diagnosis not present

## 2019-03-26 ENCOUNTER — Emergency Department (HOSPITAL_COMMUNITY)
Admission: EM | Admit: 2019-03-26 | Discharge: 2019-03-27 | Disposition: A | Discharge status: Home / Self Care | Payer: 59 | Attending: Emergency Medicine | Admitting: Emergency Medicine

## 2019-03-26 ENCOUNTER — Encounter (HOSPITAL_COMMUNITY): Payer: Self-pay | Admitting: Emergency Medicine

## 2019-03-26 ENCOUNTER — Other Ambulatory Visit: Payer: Self-pay

## 2019-03-26 DIAGNOSIS — Z79899 Other long term (current) drug therapy: Secondary | ICD-10-CM | POA: Diagnosis not present

## 2019-03-26 DIAGNOSIS — Y999 Unspecified external cause status: Secondary | ICD-10-CM | POA: Diagnosis not present

## 2019-03-26 DIAGNOSIS — S0502XA Injury of conjunctiva and corneal abrasion without foreign body, left eye, initial encounter: Secondary | ICD-10-CM | POA: Insufficient documentation

## 2019-03-26 DIAGNOSIS — W228XXA Striking against or struck by other objects, initial encounter: Secondary | ICD-10-CM | POA: Insufficient documentation

## 2019-03-26 DIAGNOSIS — Y9389 Activity, other specified: Secondary | ICD-10-CM | POA: Diagnosis not present

## 2019-03-26 DIAGNOSIS — Y92009 Unspecified place in unspecified non-institutional (private) residence as the place of occurrence of the external cause: Secondary | ICD-10-CM | POA: Diagnosis not present

## 2019-03-26 DIAGNOSIS — S0592XA Unspecified injury of left eye and orbit, initial encounter: Secondary | ICD-10-CM | POA: Diagnosis present

## 2019-03-26 HISTORY — DX: Kidney donor: Z52.4

## 2019-03-26 MED ORDER — FLUORESCEIN SODIUM 1 MG OP STRP
1.0000 | ORAL_STRIP | Freq: Once | OPHTHALMIC | Status: AC
  Administered 2019-03-26: 1 via OPHTHALMIC
  Filled 2019-03-26: qty 1

## 2019-03-26 MED ORDER — TOBRAMYCIN 0.3 % OP SOLN
2.0000 [drp] | Freq: Once | OPHTHALMIC | Status: AC
  Administered 2019-03-27: 2 [drp] via OPHTHALMIC
  Filled 2019-03-26: qty 5

## 2019-03-26 MED ORDER — TETRACAINE HCL 0.5 % OP SOLN
1.0000 [drp] | Freq: Once | OPHTHALMIC | Status: AC
  Administered 2019-03-26: 1 [drp] via OPHTHALMIC
  Filled 2019-03-26: qty 4

## 2019-03-26 NOTE — ED Notes (Signed)
Refer to ED Provider documentation for further assessment

## 2019-03-26 NOTE — ED Provider Notes (Signed)
[facility] EMERGENCY DEPARTMENT Provider Note   CSN: [id number] Arrival date & time: 03/26/19  2018     History   Chief Complaint Chief Complaint  Patient presents with  . Foreign Body- left Eye    HPI Ruben Leonard is a 63 y.o. male.     Patient is a 63 year old male with no significant past medical history presenting with complaints of left eye irritation.  Patient states that he was working on a light fixture in the ceiling when debris fell and scratched his left eye.  He is concerned he may have a foreign body.  He denies any visual disturbances.  He denies any other issues.  Patient does not wear contacts or glasses.  The history is provided by the patient.    Past Medical History:  Diagnosis Date  . Acid reflux   . High cholesterol   . Kidney donor   . Vertigo     Patient Active Problem List   Diagnosis Date Noted  . Plantar plate injury 03/54/6568    Past Surgical History:  Procedure Laterality Date  . KIDNEY DONATION          Home Medications    Prior to Admission medications   Medication Sig Start Date End Date Taking? Authorizing Provider  acetaminophen (TYLENOL) 500 MG tablet Take 1,000 mg by mouth every 6 (six) hours as needed for moderate pain or headache.    [provider]  atorvastatin (LIPITOR) 20 MG tablet Take 20 mg by mouth daily.    [provider]  CIALIS 20 MG tablet Take 20 mg by mouth as needed. 01/29/15   [provider]  losartan (COZAAR) 50 MG tablet Take 50 mg by mouth daily. 07/08/16   [provider]  meclizine (ANTIVERT) 25 MG tablet Take 1 tablet (25 mg total) by mouth 3 (three) times daily as needed for dizziness. 07/09/16   Dowless, Aldona Bar Tripp, PA-C  omeprazole (PRILOSEC) 20 MG capsule Take 20 mg by mouth daily as needed (for acid reflux).     [provider]    Family History No family history on file.  Social History Social History   Tobacco Use  .  Smoking status: Never Smoker  . Smokeless tobacco: Never Used  Substance Use Topics  . Alcohol use: Yes    Alcohol/week: 0.0 standard drinks    Comment: occassionally  . Drug use: No     Allergies   Patient has no known allergies.   Review of Systems Review of Systems  All other systems reviewed and are negative.    Physical Exam Updated Vital Signs BP (!) 164/84 (BP Location: Right Arm)   Pulse (!) 58   Temp 97.8 F (36.6 C) (Oral)   Resp 18   SpO2 99%   Physical Exam Vitals signs and nursing note reviewed.  Constitutional:      General: He is not in acute distress.    Appearance: Normal appearance. He is not ill-appearing.  HENT:     Head: Normocephalic and atraumatic.  Eyes:     Comments: The left eye has some conjunctival injection.  There are no obvious corneal foreign bodies.  Lids were everted and no foreign body noted.  With fluorescein staining, there is an abrasion noted at approximately 2:00 to the periphery of the cornea.  Pulmonary:     Effort: Pulmonary effort is normal.  Neurological:     Mental Status: He is alert and oriented to person, place,  and time.      ED Treatments / Results  Labs (all labs ordered are listed, but only abnormal results are displayed) Labs Reviewed - No data to display  EKG None  Radiology No results found.  Procedures Procedures (including critical care time)  Medications Ordered in ED Medications  fluorescein ophthalmic strip 1 strip (1 strip Left Eye Given 03/26/19 2144)  tetracaine (PONTOCAINE) 0.5 % ophthalmic solution 1 drop (1 drop Left Eye Given 03/26/19 2144)     Initial Impression / Assessment and Plan / ED Course  I have reviewed the triage vital signs and the nursing notes.  Pertinent labs & imaging results that were available during my care of the patient were reviewed by me and considered in my medical decision making (see chart for details).  Patient with corneal abrasion, but no corneal foreign  body.  This will be treated with antibiotic drops and follow-up as needed.  Final Clinical Impressions(s) / ED Diagnoses   Final diagnoses:  None    ED Discharge Orders    None       Veryl Speak, MD 03/26/19 2345

## 2019-03-26 NOTE — ED Triage Notes (Signed)
Patient reports foreign body at left eye sustained while working at his basement this evening , pain with lacrimation , no vision loss.

## 2019-03-26 NOTE — Discharge Instructions (Addendum)
Tobrex eyedrops: 2 drops 4 times daily for the next 3 days.    Ibuprofen 600 mg every 6 hours as needed for pain.  Return to the emergency department if you develop worsening eye pain, visual disturbances, or other new and concerning symptoms.

## 2019-08-03 ENCOUNTER — Other Ambulatory Visit: Payer: Self-pay

## 2019-08-03 ENCOUNTER — Ambulatory Visit (INDEPENDENT_AMBULATORY_CARE_PROVIDER_SITE_OTHER): Payer: 59 | Admitting: Pulmonary Disease

## 2019-08-03 ENCOUNTER — Encounter: Payer: Self-pay | Admitting: Pulmonary Disease

## 2019-08-03 DIAGNOSIS — G4733 Obstructive sleep apnea (adult) (pediatric): Secondary | ICD-10-CM | POA: Diagnosis not present

## 2019-08-03 DIAGNOSIS — Z9989 Dependence on other enabling machines and devices: Secondary | ICD-10-CM

## 2019-08-03 NOTE — Assessment & Plan Note (Addendum)
We will try to obtain sleep studies from DME. CPAP of 13 cm seems to be working well.  He is very compliant and CPAP is certainly helped improve his daytime somnolence and fatigue CPAP supplies including new mask, hose and filters will be renewed He can contact DME right away to obtain these.  His download shows that the leak has increased over the last month due to mask getting old and hopefully a newer mask will seal the leak  Prescription for new CPAP 13 cm will be sent to DME alert checked in 1 month after  Weight loss encouraged, compliance with goal of at least 4-6 hrs every night is the expectation. Advised against medications with sedative side effects Cautioned against driving when sleepy - understanding that sleepiness will vary on a day to day basis

## 2019-08-03 NOTE — Patient Instructions (Signed)
CPAP supplies including new mask, hose and filters will be renewed You can contact DME right away to obtain these.  Prescription for new CPAP 13 cm will be sent to DME Obtain sleep studies from them

## 2019-08-03 NOTE — Progress Notes (Signed)
Subjective:    Patient ID: Ruben Leonard, male    DOB: 01-24-1956, 63 y.o.   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 He underwent a sleep study many years ago by my partner Dr. Gwenette Greet which showed severe OSA.  He was started on CPAP of 13 cm with full facemask which really change his life and improve his daytime somnolence and fatigue and cut out his snoring at night.  He has been very compliant with the machine and has settled down well with a full facemask.  He has been unable to get new supplies from the DME, he was lost to follow-up and has not followed up with [place] in many years.  He is now getting a message on his machine that the motor is exceeded its life. There is no sleep study is unavailable to me at the time of dictation. Epworth sleepiness score is 1 and he denies daytime somnolence and fatigue. Bedtime is between 11 and 12 midnight, sleep latency is up to 30 minutes, he sleeps on his back with 2 pillows, denies nocturnal awakenings or nocturia and is out of bed by 7 AM feeling rested without dryness of mouth or headaches.  He does use the humidity on the machine His weight has remained steady between 202 5 pounds There is no history suggestive of cataplexy, sleep paralysis or parasomnias   He keeps good health.  He donated a kidney to his father in 2005.  He takes losartan for renal protection He is a never smoker and social drinker  We obtained the card from his current CPAP machine and obtained a download.  This shows excellent compliance more than 7 hours every night on CPAP 13 cm with good control of events and residual AHI of 3/hour and minimal leak.  Leak has worsened over the past month likely due to an old mask    Past Medical History:  Diagnosis Date  . Acid reflux   . High cholesterol   . Kidney donor   . Vertigo    Past Surgical History:  Procedure Laterality Date  . KIDNEY DONATION     No Known  Allergies   Social History   Socioeconomic History  . Marital status: Married    Spouse name: Not on file  . Number of children: Not on file  . Years of education: Not on file  . Highest education level: Not on file  Occupational History  . Not on file  Tobacco Use  . Smoking status: Never Smoker  . Smokeless tobacco: Never Used  Substance and Sexual Activity  . Alcohol use: Yes    Alcohol/week: 0.0 standard drinks    Comment: occassionally  . Drug use: No  . Sexual activity: Not on file  Other Topics Concern  . Not on file  Social History Narrative  . Not on file   Social Determinants of Health   Financial Resource Strain:   . Difficulty of Paying Living Expenses: Not on file  Food Insecurity:   . Worried About Charity fundraiser in the Last Year: Not on file  . Ran Out of Food in the Last Year: Not on file  Transportation Needs:   . Lack of Transportation (Medical): Not on file  . Lack of Transportation (Non-Medical): Not on file  Physical Activity:   . Days of Exercise per Week: Not on file  . Minutes of Exercise per Session: Not on file  Stress:   . Feeling of Stress : Not on file  Social Connections:   . Frequency of Communication with Friends and Family: Not on file  . Frequency of Social Gatherings with Friends and Family: Not on file  . Attends Religious Services: Not on file  . Active Member of Clubs or Organizations: Not on file  . Attends Archivist Meetings: Not on file  . Marital Status: Not on file  Intimate Partner Violence:   . Fear of Current or Ex-Partner: Not on file  . Emotionally Abused: Not on file  . Physically Abused: Not on file  . Sexually Abused: Not on file      History reviewed. No pertinent family history. Father had renal transplant and cancer  Review of Systems Constitutional: negative for anorexia, fevers and sweats  Eyes: negative for irritation, redness and visual disturbance  Ears, nose, mouth, throat, and  face: negative for earaches, epistaxis, nasal congestion and sore throat  Respiratory: negative for cough, dyspnea on exertion, sputum and wheezing  Cardiovascular: negative for chest pain, dyspnea, lower extremity edema, orthopnea, palpitations and syncope  Gastrointestinal: negative for abdominal pain, constipation, diarrhea, melena, nausea and vomiting  Genitourinary:negative for dysuria, frequency and hematuria  Hematologic/lymphatic: negative for bleeding, easy bruising and lymphadenopathy  Musculoskeletal:negative for arthralgias, muscle weakness and stiff joints  Neurological: negative for coordination problems, gait problems, headaches and weakness  Endocrine: negative for diabetic symptoms including polydipsia, polyuria and weight loss     Objective:   Physical Exam  Gen. Pleasant, well-nourished, in no distress, normal affect ENT - no pallor,icterus, no post nasal drip, class II airway, uvula sticks to back of tongue Neck: No JVD, no thyromegaly, no carotid bruits Lungs: no use of accessory muscles, no dullness to percussion, clear without rales or rhonchi  Cardiovascular: Rhythm regular, heart sounds  normal, no murmurs or gallops, no peripheral edema Abdomen: soft and non-tender, no hepatosplenomegaly, BS normal. Musculoskeletal: No deformities, no cyanosis or clubbing Neuro:  alert, non focal       Assessment & Plan:

## 2020-08-02 ENCOUNTER — Ambulatory Visit: Payer: 59 | Admitting: Pulmonary Disease

## 2021-01-10 ENCOUNTER — Ambulatory Visit: Payer: 59

## 2021-01-10 ENCOUNTER — Ambulatory Visit: Payer: 59 | Admitting: Adult Health

## 2021-01-10 ENCOUNTER — Ambulatory Visit (INDEPENDENT_AMBULATORY_CARE_PROVIDER_SITE_OTHER): Payer: 59

## 2021-01-10 ENCOUNTER — Encounter: Payer: Self-pay | Admitting: Adult Health

## 2021-01-10 ENCOUNTER — Other Ambulatory Visit: Payer: Self-pay

## 2021-01-10 VITALS — BP 144/82 | HR 66 | Temp 97.3°F | Ht 70.0 in | Wt 202.0 lb

## 2021-01-10 DIAGNOSIS — R079 Chest pain, unspecified: Secondary | ICD-10-CM

## 2021-01-10 DIAGNOSIS — R0789 Other chest pain: Secondary | ICD-10-CM

## 2021-01-10 DIAGNOSIS — Z9989 Dependence on other enabling machines and devices: Secondary | ICD-10-CM | POA: Diagnosis not present

## 2021-01-10 DIAGNOSIS — G4733 Obstructive sleep apnea (adult) (pediatric): Secondary | ICD-10-CM | POA: Diagnosis not present

## 2021-01-10 NOTE — Assessment & Plan Note (Signed)
Excellent control and compliance on CPAP.  Continue on current setting

## 2021-01-10 NOTE — Patient Instructions (Signed)
Refer to Cardiology .  No running or exercise until after cardiology evaluation  If chest pain returns go to ER /911 .  Albuterol inhaler 1-2 puffs every 6hr as needed for dyspnea.  Chest xray today  Continue on CPAP At bedtime   Keep up good work .  Follow up with Dr. Elsworth Soho  In 4-6 weeks with PFT and As needed

## 2021-01-10 NOTE — Progress Notes (Signed)
@Patient  ID: Ruben Leonard, male    DOB: Jan 21, 1956, 65 y.o.   MRN: [id number]  Chief Complaint  Patient presents with  . Follow-up    Referring provider: Haywood Pao, MD  HPI: 65 year old male seen for sleep consult December 2020 to establish for sleep apnea. Patient was diagnosed with severe sleep apnea years ago. Medical history -  He did donate a kidney to his father in 2005.  TEST/EVENTS :   01/10/2021 Follow up : Chest pain  Patient presents for a follow-up visit.  Patient is followed for obstructive sleep apnea.  However patient says that he is coming in today to be evaluated for chest pain and pressure that developed about 10 days ago.  Patient says he is very active exercises just about every day.  He typically runs about 4 miles each day.  5 days a week.  And he works out in [employer] 5 days a week.  He is a retired Airline pilot.  Patient reports he is has no history of breathing difficulties in the past no asthma history.  Took breathing test as a Airline pilot on a regular basis with no reported difficulties.  Patient complains that about 10 days ago he started to have some chest pain and pressure across his chest only when he was running.  Seem to subside when he stopped.  Over the last few days has noticed it more along the right upper chest.  No associated syncope diaphoresis palpitations or radiating pains.  Denies any reflux.  Patient says he really does not have any medical problems.  He did donate a kidney to his father in 2005.  He is on losartan for kidney protection.  Family history is positive for mini stroke in his father.  No known history of coronary artery disease.  He has never smoked.  He denies any cough or wheezing. Patient denies any current chest pain.  No calf pain no hemoptysis.  O2 saturations 97% on room air today. Patient says he feels very good today in the office has no complaints. Denies any nausea vomiting or diarrhea.  Appetite is  good.  Patient is married.  Retired.  Has 2 adult children.  No drug use.  No alcohol.  Drinks 1 cup of coffee each day.  1 energy drink maybe weekly.  Patient says he had his physical 2 weeks ago and had lab work that was reported as normal  Patient does have obstructive sleep apnea is on nocturnal CPAP.  Patient says he never misses a night his CPAP.  He feels rested with no significant daytime sleepiness.  CPAP download shows excellent 100% compliance.  Daily average usage 8 hours.  Patient is on CPAP 13 cm H2O.  AHI is 2.2.  No Known Allergies  Immunization History  Administered Date(s) Administered  . Influenza, High Dose Seasonal PF 04/25/2019, 08/21/2020  . PFIZER(Purple Top)SARS-COV-2 Vaccination 10/31/2019, 11/21/2019, 06/25/2020    Past Medical History:  Diagnosis Date  . Acid reflux   . High cholesterol   . Kidney donor   . Vertigo     Tobacco History: Social History   Tobacco Use  Smoking Status Never Smoker  Smokeless Tobacco Never Used   Counseling given: Not Answered   Outpatient Medications Prior to Visit  Medication Sig Dispense Refill  . atorvastatin (LIPITOR) 20 MG tablet Take 20 mg by mouth daily.    . fluticasone (FLONASE) 50 MCG/ACT nasal spray as needed.    Marland Kitchen losartan (COZAAR)  50 MG tablet Take 50 mg by mouth daily.     No facility-administered medications prior to visit.     Review of Systems:   Constitutional:   No  weight loss, night sweats,  Fevers, chills, fatigue, or  lassitude.  HEENT:   No headaches,  Difficulty swallowing,  Tooth/dental problems, or  Sore throat,                No sneezing, itching, ear ache, nasal congestion, post nasal drip,   CV:  No   Orthopnea, PND, swelling in lower extremities, anasarca, dizziness, palpitations, syncope.   GI  No heartburn, indigestion, abdominal pain, nausea, vomiting, diarrhea, change in bowel habits, loss of appetite, bloody stools.   Resp: No shortness of breath with exertion or at rest.   No excess mucus, no productive cough,  No non-productive cough,  No coughing up of blood.  No change in color of mucus.  No wheezing.  No chest wall deformity  Skin: no rash or lesions.  GU: no dysuria, change in color of urine, no urgency or frequency.  No flank pain, no hematuria   MS:  No joint pain or swelling.  No decreased range of motion.  No back pain.    Physical Exam  BP (!) 144/82 (BP Location: Left Arm, Cuff Size: Normal)   Pulse 66   Temp (!) 97.3 F (36.3 C)   Ht 5\' 10"  (1.778 m)   Wt 202 lb (91.6 kg)   SpO2 97%   BMI 28.98 kg/m   GEN: A/Ox3; pleasant , NAD, well nourished    HEENT:  [place]/AT,   , NOSE-clear, THROAT-clear, no lesions, no postnasal drip or exudate noted.   NECK:  Supple w/ fair ROM; no JVD; normal carotid impulses w/o bruits; no thyromegaly or nodules palpated; no lymphadenopathy.    RESP  Clear  P & A; w/o, wheezes/ rales/ or rhonchi. no accessory muscle use, no dullness to percussion  CARD:  RRR, no m/r/g, no peripheral edema, pulses intact, no cyanosis or clubbing.  GI:   Soft & nt; nml bowel sounds; no organomegaly or masses detected.   Musco: Warm bil, no deformities or joint swelling noted.   Neuro: alert, no focal deficits noted.    Skin: Warm, no lesions or rashes  EKG shows normal sinus rhythm with heart rate at 68 inverted T waves and V1, V2, V3, aVR and aVL.  Poor R wave progression.  Lab Results:  CBC  BNP No results found for: BNP  ProBNP No results found for: PROBNP  Imaging: DG Chest 2 View  Result Date: 01/10/2021 CLINICAL DATA:  Chest pain EXAM: CHEST - 2 VIEW COMPARISON:  07/28/2013 FINDINGS: The heart size and mediastinal contours are within normal limits. Both lungs are clear. Degenerative changes of the spine. IMPRESSION: No active cardiopulmonary disease. Electronically Signed   By: Donavan Foil M.D.   On: 01/10/2021 17:15      No flowsheet data found.  No results found for:  NITRICOXIDE      Assessment & Plan:   Chest tightness Episodes of exertional chest tightness over the last 10 days mainly occurring with exercise.  EKG today shows nonspecific changes with no acute process noted.  Reviewed EKG with Dr. Loanne Drilling .  Patient is currently not having any chest pain.  Have advised him he needs to be seen by cardiology for further evaluation to rule out underlying cardiac source.  He is advised to do no exercise weightlifting.  If he develops chest pain or tightness again he is to seek emergency room care.  Recent lab work results have been requested. Check chest x-ray today. Return for PFTs.  Could have a component of some reactive airways.  Albuterol inhaler as needed  Plan  Patient Instructions  Refer to Cardiology .  No running or exercise until after cardiology evaluation  If chest pain returns go to ER /911 .  Albuterol inhaler 1-2 puffs every 6hr as needed for dyspnea.  Chest xray today  Continue on CPAP At bedtime   Keep up good work .  Follow up with Dr. Elsworth Soho  In 4-6 weeks with PFT and As needed        OSA on CPAP Excellent control and compliance on CPAP.  Continue on current setting     Rexene Edison, NP 01/10/2021

## 2021-01-10 NOTE — Assessment & Plan Note (Signed)
Episodes of exertional chest tightness over the last 10 days mainly occurring with exercise.  EKG today shows nonspecific changes with no acute process noted.  Reviewed EKG with Dr. Loanne Drilling .  Patient is currently not having any chest pain.  Have advised him he needs to be seen by cardiology for further evaluation to rule out underlying cardiac source.  He is advised to do no exercise weightlifting.  If he develops chest pain or tightness again he is to seek emergency room care.  Recent lab work results have been requested. Check chest x-ray today. Return for PFTs.  Could have a component of some reactive airways.  Albuterol inhaler as needed  Plan  Patient Instructions  Refer to Cardiology .  No running or exercise until after cardiology evaluation  If chest pain returns go to ER /911 .  Albuterol inhaler 1-2 puffs every 6hr as needed for dyspnea.  Chest xray today  Continue on CPAP At bedtime   Keep up good work .  Follow up with Dr. Elsworth Soho  In 4-6 weeks with PFT and As needed

## 2021-01-14 ENCOUNTER — Telehealth: Payer: Self-pay | Admitting: Adult Health

## 2021-01-14 MED ORDER — ALBUTEROL SULFATE HFA 108 (90 BASE) MCG/ACT IN AERS: 1.0000 | INHALATION_SPRAY | Freq: Four times a day (QID) | RESPIRATORY_TRACT | 5 refills | Status: AC | PRN

## 2021-01-14 NOTE — Telephone Encounter (Signed)
Patient called stating Albuterol prescription was never received by [facility] Per last OV note, Albuterol was prescribed, but never ordered.  Albuterol inhaler sent to requested pharmacy. Nothing further at this time.    01/10/21-Tammy, NP Patient Instructions  Refer to Cardiology .  No running or exercise until after cardiology evaluation  If chest pain returns go to ER /911 .  Albuterol inhaler 1-2 puffs every 6hr as needed for dyspnea.  Chest xray today  Continue on CPAP At bedtime   Keep up good work .  Follow up with Dr. Elsworth Soho  In 4-6 weeks with PFT and As needed

## 2021-01-20 NOTE — Progress Notes (Signed)
Cardiology Office Note:   Date:  01/22/2021  NAME:  Ruben Leonard    MRN: [id number] DOB:  Sep 16, 1955   PCP:  Haywood Pao, MD  Cardiologist:  None  Electrophysiologist:  None   Referring MD: Melvenia Needles, NP   Chief Complaint  Patient presents with  . Chest Pain    History of Present Illness:   Ruben Leonard is a 65 y.o. male with a hx of OSA, HLD who is being seen today for the evaluation of chest pain at the request of Tisovec, Fransico Him, MD.  He was seen by his pulmonologist on 01/10/2021.  Apparently 2 days before that he had some congestion in his nose as well as sinuses.  He reports that he was working in the yard and he was exposed to pollen in the yard.  He reports he normally wears a mask when he does yard duty as he is exposed to several agents that he is likely allergic to.  He reports that 30 minutes into working outside he did have congestion in the sinuses as well as the naris.  He reports he awoke the next day and went on a run.  He reports he felt clogged in his chest.  He was seen by his pulmonologist and given an albuterol inhaler.  His chest congestion symptoms resolved.  He describes no pain or discomfort in the chest.  He reports it was a clogged sensation.  It has resolved since using albuterol.  Medical history significant for hypertension that is controlled on losartan.  He donated a kidney to his father 15 years ago.  He has a single kidney and this is followed by his kidney doctor.  He also has high cholesterol and he is on a statin.  He has never had a heart attack or stroke.  He does not smoke.  He drinks alcohol occasionally.  No drug use is reported.  He is a retired Airline pilot for [employer].  He exercises at a high level.  He reports that he can run 4 to 5 days/week.  He runs 3 to 4 miles per session.  No chest pain or shortness of breath with these.  His cardiovascular examination is normal.  He also lifts weights.  Again, no symptoms are  reported.  His EKG in office demonstrates sinus rhythm with no acute ischemic changes or evidence of infarction.  He has had no further recurrence of symptoms and is using his albuterol inhaler.  Problem List 1. OSA 2. T chol 198, HDL 49, LDL 131, TG 92 3. CKD -donated kidney to father   Past Medical History: Past Medical History:  Diagnosis Date  . Acid reflux   . High cholesterol   . Hypertension   . Kidney donor   . Vertigo     Past Surgical History: Past Surgical History:  Procedure Laterality Date  . KIDNEY DONATION      Current Medications: Current Meds  Medication Sig  . albuterol (VENTOLIN HFA) 108 (90 Base) MCG/ACT inhaler Inhale 1-2 puffs into the lungs every 6 (six) hours as needed for wheezing or shortness of breath.  Marland Kitchen atorvastatin (LIPITOR) 20 MG tablet Take 20 mg by mouth daily.  . fluticasone (FLONASE) 50 MCG/ACT nasal spray as needed.  Marland Kitchen losartan (COZAAR) 50 MG tablet Take 50 mg by mouth daily.     Allergies:    Patient has no known allergies.   Social History: Social History   Socioeconomic History  .  Marital status: Married    Spouse name: Not on file  . Number of children: 3  . Years of education: Not on file  . Highest education level: Not on file  Occupational History  . Occupation: retired IT trainer   Tobacco Use  . Smoking status: Never Smoker  . Smokeless tobacco: Never Used  Substance and Sexual Activity  . Alcohol use: Yes    Alcohol/week: 0.0 standard drinks    Comment: occassionally  . Drug use: No  . Sexual activity: Not on file  Other Topics Concern  . Not on file  Social History Narrative  . Not on file   Social Determinants of Health   Financial Resource Strain: Not on file  Food Insecurity: Not on file  Transportation Needs: Not on file  Physical Activity: Not on file  Stress: Not on file  Social Connections: Not on file     Family History: The patient's family history includes Kidney disease in his  father.  ROS:   All other ROS reviewed and negative. Pertinent positives noted in the HPI.     EKGs/Labs/Other Studies Reviewed:   The following studies were personally reviewed by me today:  EKG:  EKG is  ordered today.  The ekg ordered today demonstrates normal sinus rhythm heart rate 60, no acute ischemic change or evidence of infarct, and was personally reviewed by me.   Recent Labs: No results found for requested labs within last 8760 hours.   Recent Lipid Panel No results found for: CHOL, TRIG, HDL, CHOLHDL, VLDL, LDLCALC, LDLDIRECT  Physical Exam:   VS:  BP (!) 142/60 (BP Location: Left Arm, Patient Position: Sitting, Cuff Size: Normal)   Pulse 60   Ht 5\' 10"  (1.778 m)   Wt 201 lb 3.2 oz (91.3 kg)   SpO2 99%   BMI 28.87 kg/m    Wt Readings from Last 3 Encounters:  01/22/21 201 lb 3.2 oz (91.3 kg)  01/10/21 202 lb (91.6 kg)  08/03/19 202 lb 6.4 oz (91.8 kg)    General: Well nourished, well developed, in no acute distress Head: Atraumatic, normal size  Eyes: PEERLA, EOMI  Neck: Supple, no JVD Endocrine: No thryomegaly Cardiac: Normal S1, S2; RRR; no murmurs, rubs, or gallops Lungs: Clear to auscultation bilaterally, no wheezing, rhonchi or rales  Abd: Soft, nontender, no hepatomegaly  Ext: No edema, pulses 2+ Musculoskeletal: No deformities, BUE and BLE strength normal and equal Skin: Warm and dry, no rashes   Neuro: Alert and oriented to person, place, time, and situation, CNII-XII grossly intact, no focal deficits  Psych: Normal mood and affect   ASSESSMENT:   Ruben Leonard is a 64 y.o. male who presents for the following: 1. Chest pain, unspecified type     PLAN:   1. Chest pain, unspecified type -He reports atypical chest discomfort.  It was triggered by likely exposure to pollen while working in the yard.  His symptoms resolved with using an albuterol inhaler. -No further chest discomfort reported.  EKG in office demonstrates sinus rhythm with no acute  ischemic changes.  His cardiovascular examination is normal.  He can run 3 to 4 miles 4 to 5 days/week.  No chest pain or shortness of breath with this.  He is a Nutritional therapist.  He needs no further testing. -We discussed calcium scoring for further CVD risk assessment.  He will think about this.  Overall he is low risk.   Disposition: Return if symptoms worsen or fail to  improve.  Medication Adjustments/Labs and Tests Ordered: Current medicines are reviewed at length with the patient today.  Concerns regarding medicines are outlined above.  Orders Placed This Encounter  Procedures  . EKG 12-Lead   No orders of the defined types were placed in this encounter.   Patient Instructions  Medication Instructions:  The current medical regimen is effective;  continue present plan and medications.  *If you need a refill on your cardiac medications before your next appointment, please call your pharmacy*  Follow-Up: At [facility], you and your health needs are our priority.  As part of our continuing mission to provide you with exceptional heart care, we have created designated Provider Care Teams.  These Care Teams include your primary Cardiologist (physician) and Advanced Practice Providers (APPs -  Physician Assistants and Nurse Practitioners) who all work together to provide you with the care you need, when you need it.  We recommend signing up for the patient portal called "MyChart".  Sign up information is provided on this After Visit Summary.  MyChart is used to connect with patients for Virtual Visits (Telemedicine).  Patients are able to view lab/test results, encounter notes, upcoming appointments, etc.  Non-urgent messages can be sent to your provider as well.   To learn more about what you can do with MyChart, go to [email or web address].    Your next appointment:   As needed  The format for your next appointment:   In Person  Provider:   Eleonore Chiquito, MD        Signed, Addison Naegeli. Audie Box, MD, [facility]  [address], [place], [place] [address] [phone]  01/22/2021 1:58 PM

## 2021-01-22 ENCOUNTER — Ambulatory Visit: Payer: 59 | Admitting: Cardiovascular Disease

## 2021-01-22 ENCOUNTER — Other Ambulatory Visit: Payer: Self-pay

## 2021-01-22 ENCOUNTER — Encounter: Payer: Self-pay | Admitting: Cardiovascular Disease

## 2021-01-22 VITALS — BP 142/60 | HR 60 | Ht 70.0 in | Wt 201.2 lb

## 2021-01-22 DIAGNOSIS — R079 Chest pain, unspecified: Secondary | ICD-10-CM | POA: Diagnosis not present

## 2021-01-22 NOTE — Patient Instructions (Signed)
Medication Instructions:  The current medical regimen is effective;  continue present plan and medications.  *If you need a refill on your cardiac medications before your next appointment, please call your pharmacy*    Follow-Up: At [facility], you and your health needs are our priority.  As part of our continuing mission to provide you with exceptional heart care, we have created designated Provider Care Teams.  These Care Teams include your primary Cardiologist (physician) and Advanced Practice Providers (APPs -  Physician Assistants and Nurse Practitioners) who all work together to provide you with the care you need, when you need it.  We recommend signing up for the patient portal called "MyChart".  Sign up information is provided on this After Visit Summary.  MyChart is used to connect with patients for Virtual Visits (Telemedicine).  Patients are able to view lab/test results, encounter notes, upcoming appointments, etc.  Non-urgent messages can be sent to your provider as well.   To learn more about what you can do with MyChart, go to [email or web address].    Your next appointment:   As needed  The format for your next appointment:   In Person  Provider:   Jasper O'Neal, MD      

## 2021-09-30 ENCOUNTER — Other Ambulatory Visit: Payer: Self-pay

## 2021-09-30 ENCOUNTER — Ambulatory Visit: Payer: 59

## 2021-09-30 ENCOUNTER — Ambulatory Visit: Payer: 59 | Admitting: Podiatry

## 2021-09-30 DIAGNOSIS — M79671 Pain in right foot: Secondary | ICD-10-CM

## 2021-09-30 DIAGNOSIS — L603 Nail dystrophy: Secondary | ICD-10-CM

## 2021-10-02 NOTE — Progress Notes (Signed)
Subjective:   Patient ID: Ruben Leonard, male   DOB: 66 y.o.   MRN: [id number]   HPI 66 year old male presents the office today for concerns of his right big toenail becoming thick and discolored.  This been ongoing for last 3 years.  He previously had a nail culture which was negative for fungus of the nail has remained thick and discolored he is tired of looking at it.  No significant pain denies any swelling or redness or any drainage.  Has no other concerns.   Review of Systems  All other systems reviewed and are negative.  Past Medical History:  Diagnosis Date   Acid reflux    High cholesterol    Hypertension    Kidney donor    Vertigo     Past Surgical History:  Procedure Laterality Date   KIDNEY DONATION       Current Outpatient Medications:    albuterol (VENTOLIN HFA) 108 (90 Base) MCG/ACT inhaler, Inhale 1-2 puffs into the lungs every 6 (six) hours as needed for wheezing or shortness of breath., Disp: 8 g, Rfl: 5   atorvastatin (LIPITOR) 20 MG tablet, Take 20 mg by mouth daily., Disp: , Rfl:    fluticasone (FLONASE) 50 MCG/ACT nasal spray, as needed., Disp: , Rfl:    losartan (COZAAR) 50 MG tablet, Take 50 mg by mouth daily., Disp: , Rfl:    Omeprazole Magnesium (PRILOSEC) 10 MG PACK, Take 10 mg by mouth as needed., Disp: , Rfl:   No Known Allergies        Objective:  Physical Exam  General: AAO x3, NAD  Dermatological: Right hallux toenails hypertrophic, dystrophic with brown discoloration.  Is currently end of the distal portion of the toe.  No edema, erythema, drainage or pus or any signs of infection.  There are no open lesions.  Vascular: Dorsalis Pedis artery and Posterior Tibial artery pedal pulses are 2/4 bilateral with immedate capillary fill time.  There is no pain with calf compression, swelling, warmth, erythema.   Neruologic: Grossly intact via light touch bilateral.   Musculoskeletal: No significant pain on exam today.  Muscular strength 5/5  in all groups tested bilateral.  Gait: Unassisted, Nonantalgic.       Assessment:   Right hallux onychodystrophy     Plan:  -Treatment options discussed including all alternatives, risks, and complications -Etiology of symptoms were discussed -We discussed different treatment options including treating the nail conservatively versus nail removal normal as sharp including the nail with any complications or bleeding.  I will order compound through [facility] for urea 40% gel.  If no improvement consider nail removal  Trula Slade DPM

## 2021-10-07 ENCOUNTER — Telehealth: Payer: Self-pay | Admitting: *Deleted

## 2021-10-07 NOTE — Telephone Encounter (Signed)
Patient is calling for status on a prescription that was sent to pharmacy([facility]), please advise.

## 2021-10-08 NOTE — Telephone Encounter (Signed)
Faxed the prescription to [facility], received confirmation-10/08/21.

## 2022-08-15 ENCOUNTER — Emergency Department (HOSPITAL_BASED_OUTPATIENT_CLINIC_OR_DEPARTMENT_OTHER): Payer: PPO

## 2022-08-15 ENCOUNTER — Encounter (HOSPITAL_BASED_OUTPATIENT_CLINIC_OR_DEPARTMENT_OTHER): Payer: Self-pay | Admitting: Emergency Medicine

## 2022-08-15 ENCOUNTER — Emergency Department (HOSPITAL_COMMUNITY): Admission: EM | Admit: 2022-08-15 | Payer: 59 | Source: Home / Self Care

## 2022-08-15 ENCOUNTER — Emergency Department (HOSPITAL_BASED_OUTPATIENT_CLINIC_OR_DEPARTMENT_OTHER)
Admission: EM | Admit: 2022-08-15 | Discharge: 2022-08-15 | Disposition: A | Discharge status: Home / Self Care | Payer: PPO | Attending: Emergency Medicine | Admitting: Emergency Medicine

## 2022-08-15 DIAGNOSIS — R001 Bradycardia, unspecified: Secondary | ICD-10-CM | POA: Insufficient documentation

## 2022-08-15 DIAGNOSIS — R531 Weakness: Secondary | ICD-10-CM | POA: Diagnosis present

## 2022-08-15 DIAGNOSIS — Z79899 Other long term (current) drug therapy: Secondary | ICD-10-CM | POA: Insufficient documentation

## 2022-08-15 DIAGNOSIS — I10 Essential (primary) hypertension: Secondary | ICD-10-CM | POA: Diagnosis not present

## 2022-08-15 DIAGNOSIS — E860 Dehydration: Secondary | ICD-10-CM | POA: Insufficient documentation

## 2022-08-15 DIAGNOSIS — R944 Abnormal results of kidney function studies: Secondary | ICD-10-CM | POA: Diagnosis not present

## 2022-08-15 DIAGNOSIS — R42 Dizziness and giddiness: Secondary | ICD-10-CM

## 2022-08-15 LAB — URINALYSIS, ROUTINE W REFLEX MICROSCOPIC
Bilirubin Urine: NEGATIVE
Glucose, UA: NEGATIVE mg/dL
Hgb urine dipstick: NEGATIVE
Ketones, ur: NEGATIVE mg/dL
Leukocytes,Ua: NEGATIVE
Nitrite: NEGATIVE
Protein, ur: NEGATIVE mg/dL
Specific Gravity, Urine: 1.016 (ref 1.005–1.030)
pH: 6 (ref 5.0–8.0)

## 2022-08-15 LAB — CBC
HCT: 47 % (ref 39.0–52.0)
Hemoglobin: 15.4 g/dL (ref 13.0–17.0)
MCH: 27.4 pg (ref 26.0–34.0)
MCHC: 32.8 g/dL (ref 30.0–36.0)
MCV: 83.5 fL (ref 80.0–100.0)
Platelets: 233 10*3/uL (ref 150–400)
RBC: 5.63 MIL/uL (ref 4.22–5.81)
RDW: 13.7 % (ref 11.5–15.5)
WBC: 7.4 10*3/uL (ref 4.0–10.5)
nRBC: 0 % (ref 0.0–0.2)

## 2022-08-15 LAB — CK: Total CK: 73 U/L (ref 49–397)

## 2022-08-15 LAB — BASIC METABOLIC PANEL
Anion gap: 8 (ref 5–15)
BUN: 28 mg/dL — ABNORMAL HIGH (ref 8–23)
CO2: 26 mmol/L (ref 22–32)
Calcium: 9.6 mg/dL (ref 8.9–10.3)
Chloride: 102 mmol/L (ref 98–111)
Creatinine, Ser: 1.51 mg/dL — ABNORMAL HIGH (ref 0.61–1.24)
GFR, Estimated: 51 mL/min — ABNORMAL LOW (ref >60)
Glucose, Bld: 126 mg/dL — ABNORMAL HIGH (ref 70–99)
Potassium: 4.3 mmol/L (ref 3.5–5.1)
Sodium: 136 mmol/L (ref 135–145)

## 2022-08-15 LAB — CBG MONITORING, ED: Glucose-Capillary: 132 mg/dL — ABNORMAL HIGH (ref 70–99)

## 2022-08-15 LAB — TROPONIN I (HIGH SENSITIVITY): Troponin I (High Sensitivity): 4 ng/L (ref <18)

## 2022-08-15 MED ORDER — LACTATED RINGERS IV BOLUS
1000.0000 mL | Freq: Once | INTRAVENOUS | Status: AC
  Administered 2022-08-15: 1000 mL via INTRAVENOUS

## 2022-08-15 NOTE — Discharge Instructions (Signed)
You were seen in the emergency department today for generally feeling lightheaded and weak.  Overall your workup including physical exam, labs, CT scan and EKG were reassuring.  Like we discussed, you were slightly dehydrated based off your blood work and recommend a repeat creatinine or lab check in the next 1 to 2 weeks.  Ensure you are taking all medications as prescribed.  Follow-up with your primary care doctor regarding your visit to the ER today.  Do not hesitate to come back if you have any fainting episodes, severe chest pain, shortness of breath, difficulty making urine, or any other symptoms concerning to you.

## 2022-08-15 NOTE — ED Triage Notes (Signed)
Patient presents C/O episode of weakness/lightheadedness onset this AM. States these episodes have been occurring for several months. Concerned his "sugar may be low," no hx of diabetes. Also reports acid reflux X2 weeks.

## 2022-08-15 NOTE — ED Provider Notes (Signed)
[facility] EMERGENCY DEPT Provider Note   CSN: [id number] Arrival date & time: 08/15/22  1026     History  Chief Complaint  Patient presents with   Weakness    Ruben Leonard is a 66 y.o. male.  With PMH of HTN, HLD, history of kidney donation presenting with generalized fatigue and weakness with lightheadedness.  Patient notes that he is a runner and will intermittently run 5K's and does workout often and will sometimes after working on exerting himself he will feel lightheaded or like he is going to faint.  However this morning when he woke up he felt a little off and lightheaded.  He did eat some peanut butter and breakfast and had some fluids and felt improved but still feeling a little off and lightheaded.  Is not positional.  He had no recent head injury.  He has no active chest pain or abdominal pain however he does note intermittent GERD and reflux symptoms recently.  He has had nonbloody diarrhea.  Denies any melena or hematochezia.  He has had no fevers or other infectious symptoms at home.  He has had no change in his medications recently.  He denies any focal weakness numbness or tingling or change in vision or other neurologic symptoms.   Weakness      Home Medications Prior to Admission medications   Medication Sig Start Date End Date Taking? Authorizing Provider  albuterol (VENTOLIN HFA) 108 (90 Base) MCG/ACT inhaler Inhale 1-2 puffs into the lungs every 6 (six) hours as needed for wheezing or shortness of breath. 01/14/21   Parrett, Tammy S, NP  atorvastatin (LIPITOR) 20 MG tablet Take 20 mg by mouth daily.    [provider]  fluticasone (FLONASE) 50 MCG/ACT nasal spray as needed.    [provider]  losartan (COZAAR) 50 MG tablet Take 50 mg by mouth daily. 07/08/16   [provider]  Omeprazole Magnesium (PRILOSEC) 10 MG PACK Take 10 mg by mouth as needed.    [provider]      Allergies    Patient has no known  allergies.    Review of Systems   Review of Systems  Neurological:  Positive for weakness.    Physical Exam Updated Vital Signs BP (!) 158/97   Pulse (!) 58   Temp 98.5 F (36.9 C)   Resp 16   Ht '5\' 10"'$  (1.778 m)   Wt 86.2 kg   SpO2 100%   BMI 27.26 kg/m  Physical Exam Constitutional: Alert and oriented. Well appearing and in no distress. Eyes: Conjunctivae are normal. ENT      Head: Normocephalic and atraumatic.      Nose: No congestion.      Mouth/Throat: Mucous membranes are moist.      Neck: No stridor. Cardiovascular: S1, S2, bradycardic, regular rhythm, equal palpable radial pulses.Warm and well perfused. Respiratory: Normal respiratory effort. O2 sat 97 on RA Gastrointestinal: Soft and nontender. There is no CVA tenderness. Musculoskeletal: Normal range of motion in all extremities. No pitting edema of the lower extremities Neurologic: Normal speech and language.  CN II through XII grossly intact.  5 out of 5 strength bilateral upper and lower extremities.  Sensation grossly intact.  Steady ambulatory gait.  No gross focal neurologic deficits are appreciated. Skin: Skin is warm, dry and intact. No rash noted. Psychiatric: Mood and affect are normal. Speech and behavior are normal.  ED Results / Procedures / Treatments   Labs (all labs ordered  are listed, but only abnormal results are displayed) Labs Reviewed  BASIC METABOLIC PANEL - Abnormal; Notable for the following components:      Result Value   Glucose, Bld 126 (*)    BUN 28 (*)    Creatinine, Ser 1.51 (*)    GFR, Estimated 51 (*)    All other components within normal limits  CBG MONITORING, ED - Abnormal; Notable for the following components:   Glucose-Capillary 132 (*)    All other components within normal limits  CBC  URINALYSIS, ROUTINE W REFLEX MICROSCOPIC  CK  TROPONIN I (HIGH SENSITIVITY)    EKG EKG Interpretation  Date/Time:  Saturday August 15 2022 10:43:44 EST Ventricular Rate:   64 PR Interval:  190 QRS Duration: 88 QT Interval:  410 QTC Calculation: 422 R Axis:   79 Text Interpretation: Normal sinus rhythm Normal ECG When compared with ECG of 09-Jul-2016 11:56, PREVIOUS ECG IS PRESENT Confirmed by Georgina Snell [phone]) on 08/15/2022 12:23:16 PM  Radiology CT ABDOMEN PELVIS WO CONTRAST  Result Date: 08/15/2022 CLINICAL DATA:  Abdominal pain. Abdominal cramping and diarrhea. Kidney donor. EXAM: CT ABDOMEN AND PELVIS WITHOUT CONTRAST TECHNIQUE: Multidetector CT imaging of the abdomen and pelvis was performed following the standard protocol without IV contrast. RADIATION DOSE REDUCTION: This exam was performed according to the departmental dose-optimization program which includes automated exposure control, adjustment of the mA and/or kV according to patient size and/or use of iterative reconstruction technique. COMPARISON:  05/07/2005. FINDINGS: Lower chest: Clear lung bases. Hepatobiliary: No focal liver abnormality is seen. No gallstones, gallbladder wall thickening, or biliary dilatation. Pancreas: Unremarkable. No pancreatic ductal dilatation or surrounding inflammatory changes. Spleen: Normal in size without focal abnormality. Adrenals/Urinary Tract: Mild left adrenal gland thickening. No defined nodule. Normal right adrenal gland. Status post right nephrectomy. Left kidney normal in size, orientation and position. No left renal mass, stone or hydronephrosis. Normal left ureter. Bladder decompressed. Stomach/Bowel: Normal stomach. Small bowel and colon are normal in caliber. No wall thickening. No inflammation. Normal appendix visualized. Vascular/Lymphatic: Aortic atherosclerosis. No aneurysm. No enlarged lymph nodes. Reproductive: Prominent prostate, 4.6 cm transversely. Other: No abdominal wall hernia or abnormality. No abdominopelvic ascites. Musculoskeletal: No fracture or acute finding. No bone lesion. Degenerative changes at L5-S1 with a grade 1 anterolisthesis.  IMPRESSION: 1. No acute findings within the abdomen or pelvis. 2. Aortic atherosclerosis. 3. Kidney donor, status post right nephrectomy. Electronically Signed   By: Lajean Manes M.D.   On: 08/15/2022 13:54    Procedures Procedures    Medications Ordered in ED Medications  lactated ringers bolus 1,000 mL (0 mLs Intravenous Stopped 08/15/22 1505)    ED Course/ Medical Decision Making/ A&P                           Medical Decision Making  DAYLIN GRUSZKA is a 66 y.o. male.  With PMH of HTN, HLD, history of kidney donation presenting with generalized fatigue and weakness with lightheadedness.     Patient describes overall weakness or fatigue, rather than any particular focal weakness.  Glucose normal 126.    Regarding patient's generalized weakness, differential is broad and includes but is not limited to acute electrolyte disturbances (such as hypokalemia, hypoCa), infection, arrhythmia, atypical ACS, anemia, dehydration among multiple other etiologies. Intact neurologic exam and chronic nature of symptoms make CNS etiology such as stroke, tumor unlikely.    Cardiac etiologies such as arrhythmia or atypical ACS will be evaluated  with EKG and troponin; anemia will be ruled out with CBC; infection will be ruled out with basic infectious workup (CTAP, urinalysis); acute electrolyte abnormalities ruled out with serum studies.  IVF for dehydration.    Labs obtained and personally reviewed which showed creatinine 1.51 which appears to be at baseline however slightly elevated BUN 28 which could be suggestive of underlying dehydration.  Sodium 136 and normal as well as potassium 4.3.  No anemia hemoglobin 15.4, no concerns for bleeding especially with no complaints.  His UA was unremarkable with no signs of infection.  CK within normal limits, no concern for rhabdomyolysis  EKG NSR with no acute ST/T changes concerning for ischemia, normal QTc, no signs of WPW or ARVD.  High sensitive troponin 4  and reassuring, no concern for ACS.  CTAP without contrast obtained which I personally reviewed showed no acute intra-abdominal pathology.  Evidence of right nephrectomy.  UA without evidence of UTI  Discussed with patient that the above workup was reassuring and emergent conditions have been ruled out and patient can follow up as an outpatient.  He was given IV fluids with some improvement.  Advise close follow-up with PCP and repeat blood work check.  Advised continued p.o. hydration and following home medication regimen.  He is in agreement with plan and safe for discharge at this time.   Amount and/or Complexity of Data Reviewed Labs: ordered. Radiology: ordered.    Final Clinical Impression(s) / ED Diagnoses Final diagnoses:  Lightheadedness  Dehydration    Rx / DC Orders ED Discharge Orders     None         Elgie Congo, MD 08/15/22 1538

## 2023-02-06 LAB — AMB RESULTS CONSOLE CBG: Glucose: 130

## 2023-04-22 IMAGING — DX DG CHEST 2V
2 series · 2 of 2 positions shown · non-contrast
Comparison: 07/28/2013

CLINICAL DATA: Chest pain

EXAM:
CHEST - 2 VIEW

[chest pa]
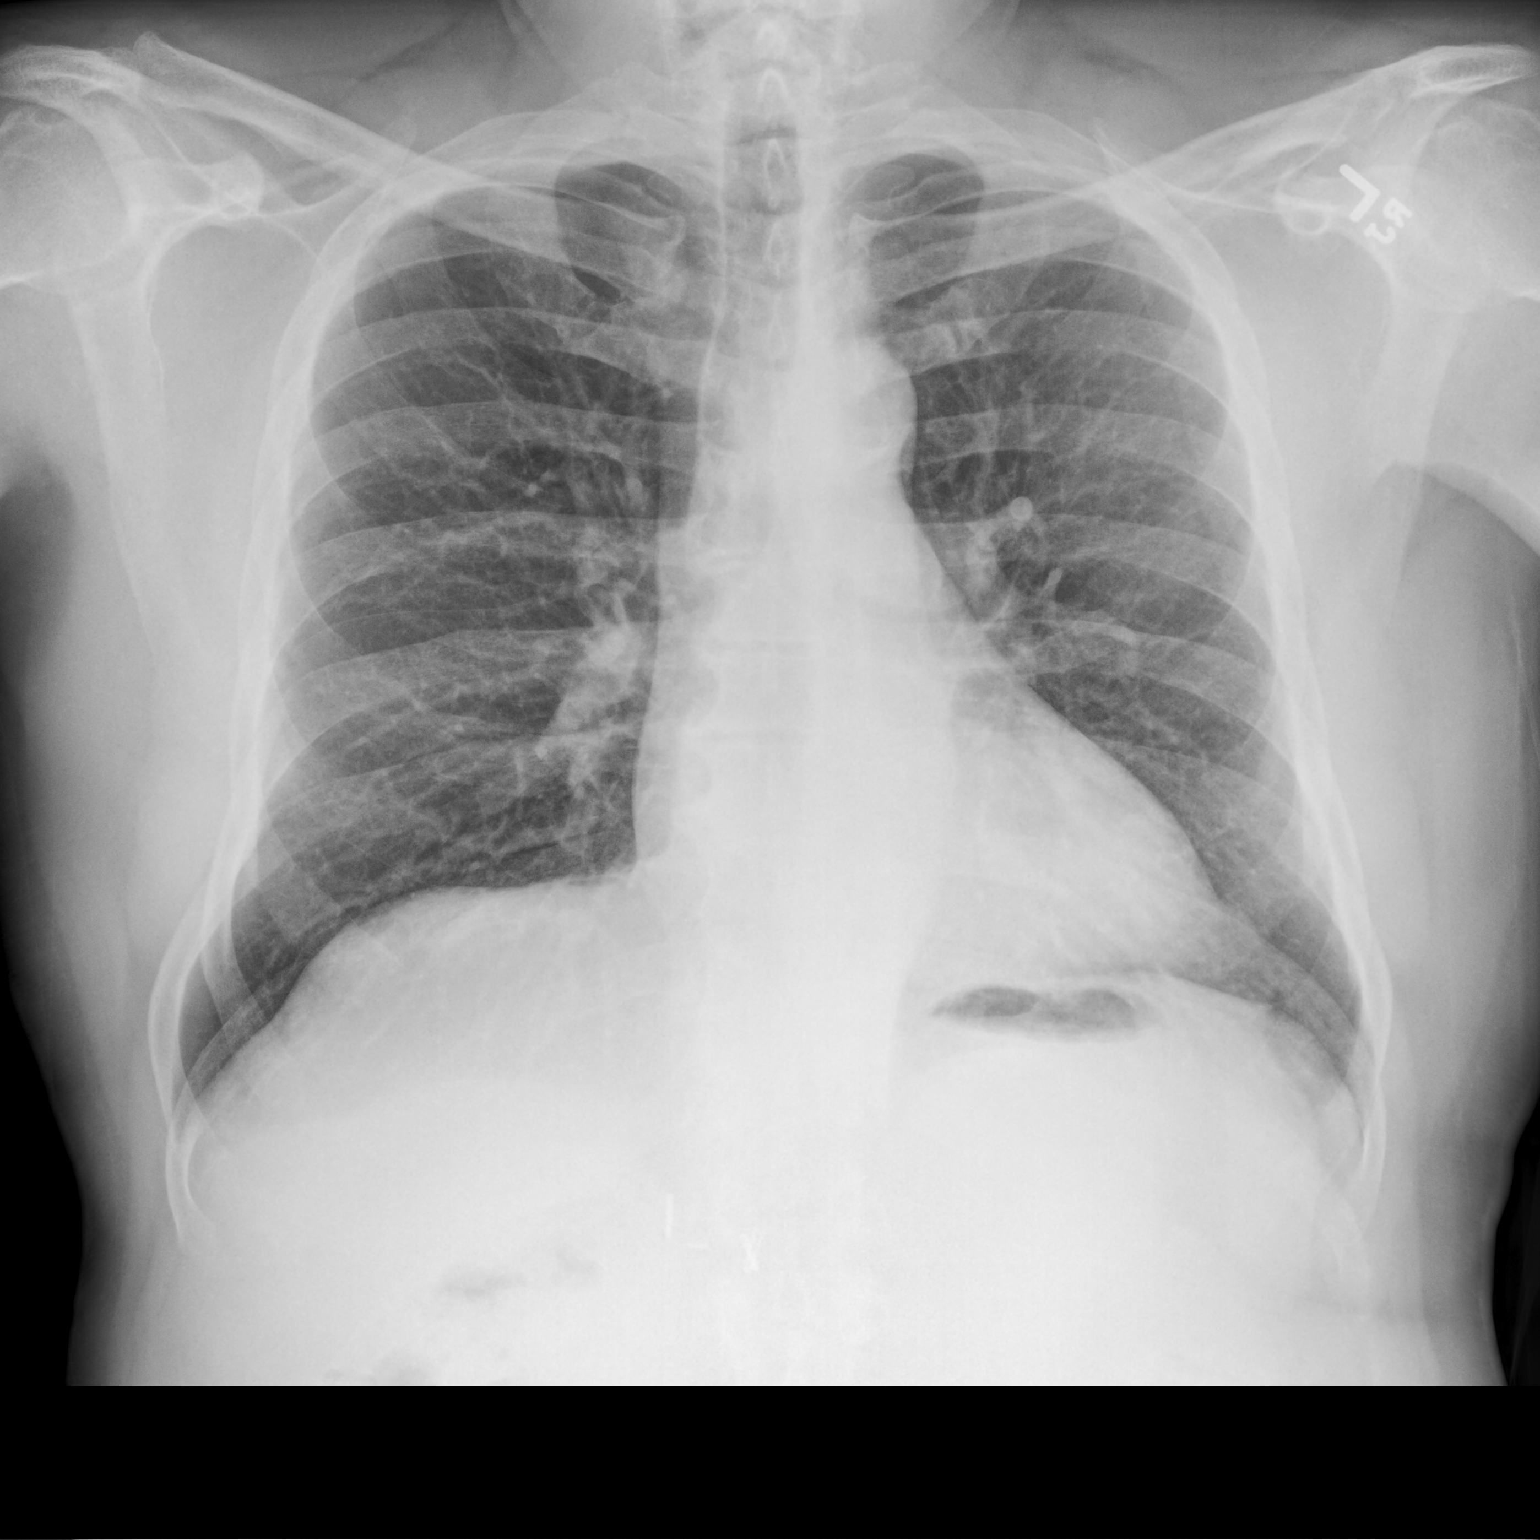

[chest lat]
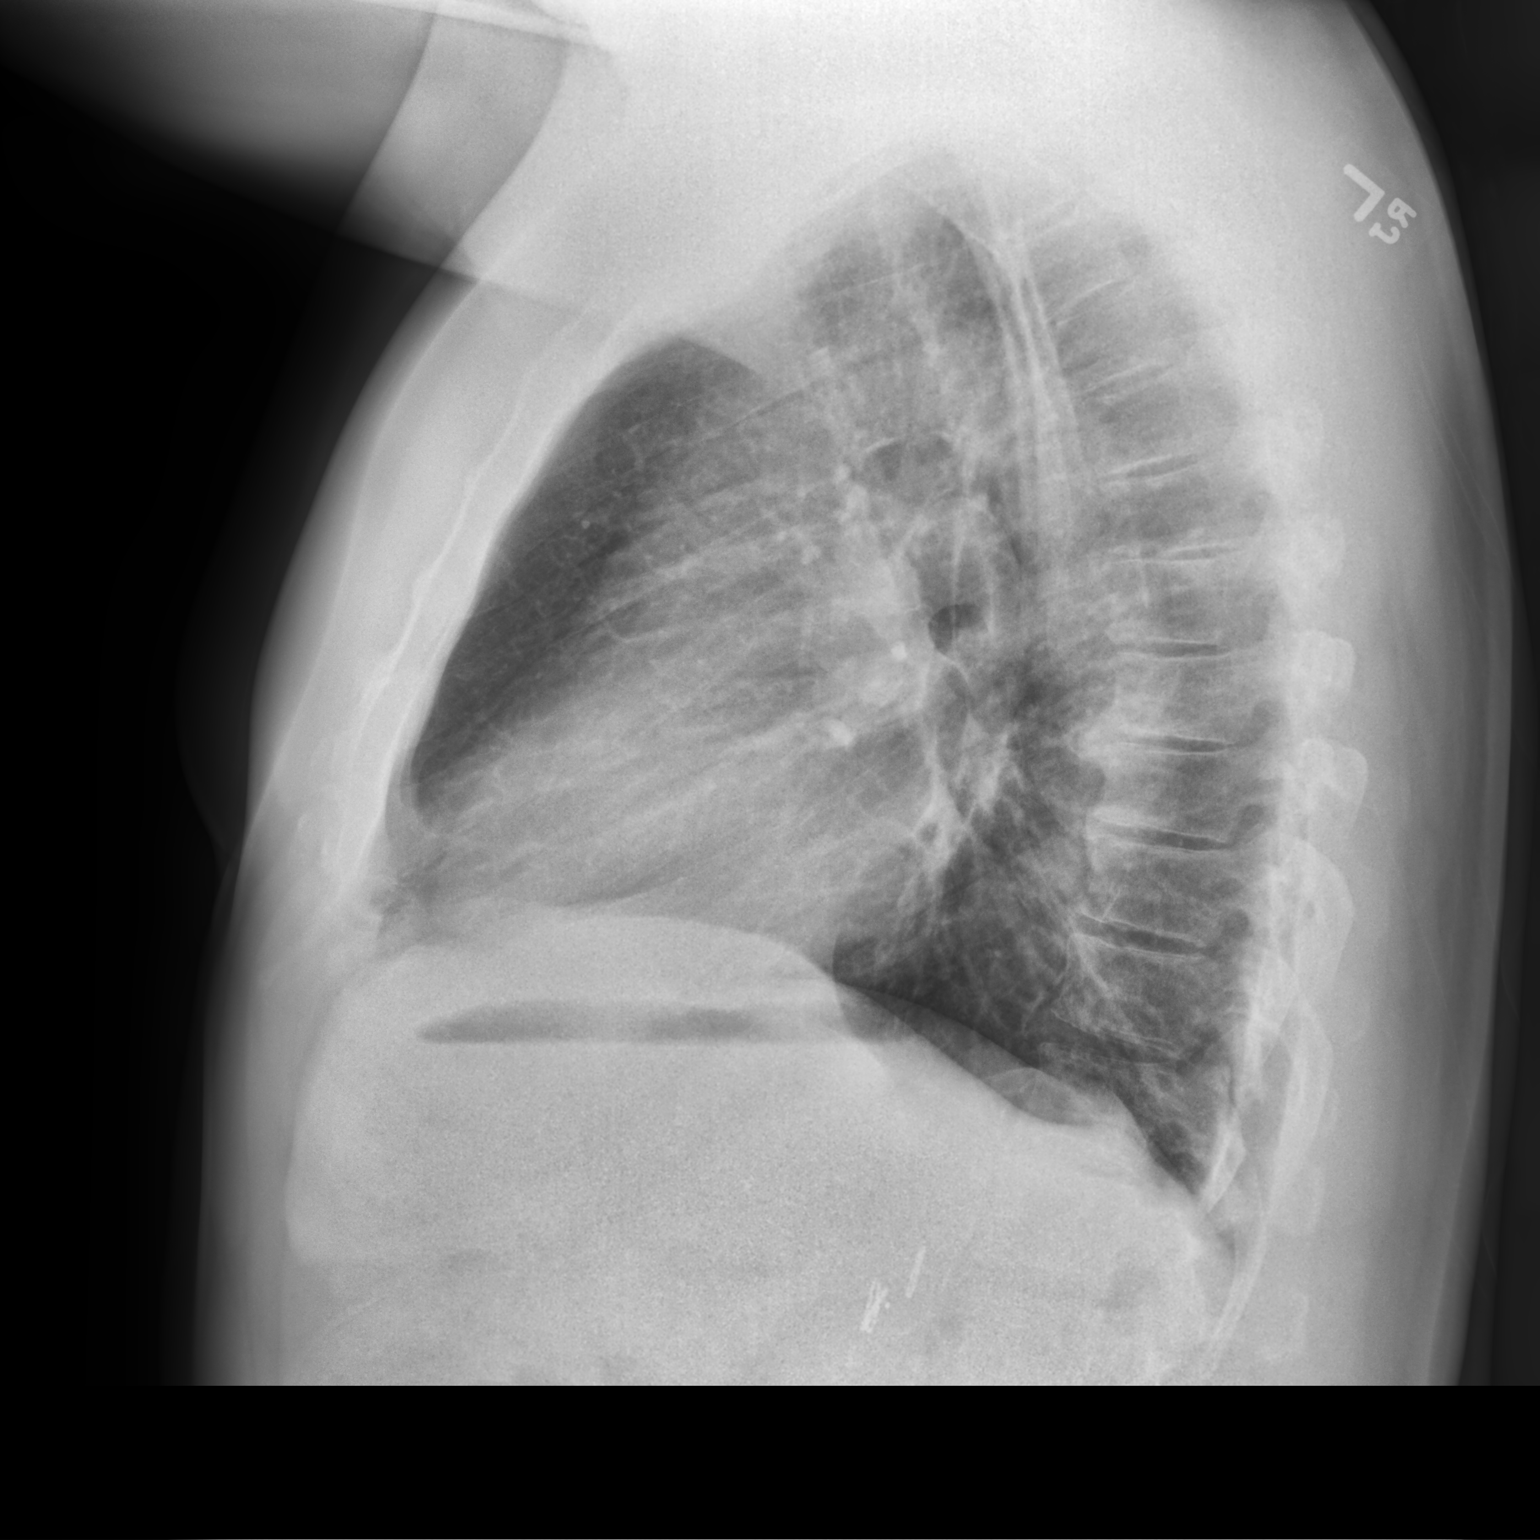

[2 of 2 positions shown; findings below may reference images not displayed]

FINDINGS: The heart size and mediastinal contours are within normal limits.
Both lungs are clear. Degenerative changes of the spine.
IMPRESSION: No active cardiopulmonary disease.

## 2023-05-09 ENCOUNTER — Emergency Department (HOSPITAL_BASED_OUTPATIENT_CLINIC_OR_DEPARTMENT_OTHER): Payer: PPO

## 2023-05-09 ENCOUNTER — Other Ambulatory Visit: Payer: Self-pay

## 2023-05-09 ENCOUNTER — Emergency Department (HOSPITAL_BASED_OUTPATIENT_CLINIC_OR_DEPARTMENT_OTHER)
Admission: EM | Admit: 2023-05-09 | Discharge: 2023-05-09 | Disposition: A | Discharge status: Home / Self Care | Payer: PPO

## 2023-05-09 ENCOUNTER — Encounter (HOSPITAL_BASED_OUTPATIENT_CLINIC_OR_DEPARTMENT_OTHER): Payer: Self-pay | Admitting: Emergency Medicine

## 2023-05-09 DIAGNOSIS — S93421A Sprain of deltoid ligament of right ankle, initial encounter: Secondary | ICD-10-CM | POA: Insufficient documentation

## 2023-05-09 DIAGNOSIS — X58XXXA Exposure to other specified factors, initial encounter: Secondary | ICD-10-CM | POA: Insufficient documentation

## 2023-05-09 DIAGNOSIS — M25571 Pain in right ankle and joints of right foot: Secondary | ICD-10-CM | POA: Diagnosis present

## 2023-05-09 NOTE — ED Provider Notes (Signed)
EMERGENCY DEPARTMENT AT [facility] Provider Note   CSN: [id number] Arrival date & time: 05/09/23  6301     History  No chief complaint on file.   Ruben Leonard is a 67 y.o. male.  67 year old male presenting emergency department for right ankle pain.  Went for a run on Friday, 4 miles does not recall any trauma or stepping funny.  Developed right ankle pain on the medial aspect of his malleolus yesterday, felt worse this morning.  No fevers, chills, numbness tingling or changes in sensation.  He is ambulatory, but with antalgic gait.        Home Medications Prior to Admission medications   Medication Sig Start Date End Date Taking? Authorizing Provider  albuterol (VENTOLIN HFA) 108 (90 Base) MCG/ACT inhaler Inhale 1-2 puffs into the lungs every 6 (six) hours as needed for wheezing or shortness of breath. 01/14/21   Parrett, Tammy S, NP  atorvastatin (LIPITOR) 20 MG tablet Take 20 mg by mouth daily.    [provider]  fluticasone (FLONASE) 50 MCG/ACT nasal spray as needed.    [provider]  losartan (COZAAR) 50 MG tablet Take 50 mg by mouth daily. 07/08/16   [provider]  Omeprazole Magnesium (PRILOSEC) 10 MG PACK Take 10 mg by mouth as needed.    [provider]      Allergies    Patient has no known allergies.    Review of Systems   Review of Systems  Physical Exam Updated Vital Signs BP (!) 150/89   Pulse (!) 59   Temp 98 F (36.7 C) (Oral)   Resp 20   SpO2 99%  Physical Exam Vitals and nursing note reviewed.  HENT:     Head: Normocephalic.     Mouth/Throat:     Mouth: Mucous membranes are moist.  Eyes:     Conjunctiva/sclera: Conjunctivae normal.  Cardiovascular:     Rate and Rhythm: Normal rate and regular rhythm.  Pulmonary:     Effort: Pulmonary effort is normal.  Abdominal:     General: There is no distension.  Musculoskeletal:     Comments: Right ankle with some mild swelling on the  medial aspect.  Full pain-free passive ROM.  Joint stable.  No tenderness to the medial or lateral malleolus.  Does have point tenderness to the anterior tibiotalar ligament.  No erythema or warmth on right as compared to left.  Skin:    General: Skin is warm.  Neurological:     General: No focal deficit present.     Mental Status: He is alert.  Psychiatric:        Mood and Affect: Mood normal.        Behavior: Behavior normal.     ED Results / Procedures / Treatments   Labs (all labs ordered are listed, but only abnormal results are displayed) Labs Reviewed - No data to display  EKG None  Radiology DG Ankle 2 Views Right  Result Date: 05/09/2023 CLINICAL DATA:  Medial pain after run EXAM: RIGHT ANKLE - 2 VIEW COMPARISON:  None Available. FINDINGS: Curvilinear cortical fragment inferior to the lateral malleolus without overlying soft tissue swelling. No acute fracture or dislocation. Normal alignment and mineralization. IMPRESSION: 1. No acute findings. 2. Curvilinear cortical fragment inferior to the lateral malleolus, likely chronic. Electronically Signed   By: Corlis Leak M.D.   On: 05/09/2023 09:18    Procedures Procedures    Medications Ordered in ED Medications -  No data to display  ED Course/ Medical Decision Making/ A&P Clinical Course as of 05/09/23 1610  [facility] May 09, 2023  9604 DG Ankle 2 Views Right IMPRESSION: 1. No acute findings. 2. Curvilinear cortical fragment inferior to the lateral malleolus, likely chronic   [TY]    Clinical Course User Index [TY] Coral Spikes, DO                                 Medical Decision Making Well-appearing 67 year old male presenting with right ankle pain.  He is afebrile nontachycardic hemodynamically stable, although slightly hypertensive.  Physical exam suggest ankle sprain.  No findings to suggest septic joint.  X-ray to evaluate for acute bony pathology as noted in ED course.  Considered labs, however patient  well-appearing, vital signs not suggestive of systemic infection and exam not consistent with infectious source.  Labs unlikely to change management or disposition at this time given my clinical suspicions.  I discussed supportive measures.  Patient stable for discharge.  Amount and/or Complexity of Data Reviewed Radiology: ordered. Decision-making details documented in ED Course.          Final Clinical Impression(s) / ED Diagnoses Final diagnoses:  None    Rx / DC Orders ED Discharge Orders     None         Coral Spikes, DO 05/09/23 5409

## 2023-05-09 NOTE — ED Triage Notes (Signed)
Friday morning, ran 4 miles, his usual, no problems. Yesterday woke up and right ankle was stiff/sore.has to use cane today, pt has family hx of gout but has never had it himself. Wondering if its gout

## 2023-05-09 NOTE — Discharge Instructions (Addendum)
Your x-ray did not show a fracture.  You likely have a sprain, see the attached handouts.  Take over-the-counter Tylenol.  Ace wrap for comfort.  Weight-bear as tolerated.  Please follow-up with your primary doctor.  Return immediately for fevers, chills, worsening pain, begin to develop redness, and warmth to the joint or severe pain or any new or worsening symptoms that are concerning to you.

## 2023-08-11 ENCOUNTER — Ambulatory Visit: Payer: 59 | Admitting: Podiatry

## 2023-08-11 ENCOUNTER — Encounter: Payer: Self-pay | Admitting: Podiatry

## 2023-08-11 DIAGNOSIS — L600 Ingrowing nail: Secondary | ICD-10-CM

## 2023-08-11 NOTE — Patient Instructions (Signed)

## 2023-08-12 NOTE — Progress Notes (Signed)
Subjective:   Patient ID: Ruben Leonard, male   DOB: 67 y.o.   MRN: [id number]   HPI Patient presents with severe thickness of both big toenails that been chronic in nature and is increasingly hard to cut with patient being very active   ROS      Objective:  Physical Exam  Neurovascular status intact good digital perfusion thickened dystrophic big toenails both feet     Assessment:  Chronic mycotic painful nailbeds hallux bilateral with probable trauma component     Plan:  H&P reviewed and at this point I have recommended removal of the nails and patient wants this done.  I explained procedure risk patient wants surgery and signed consent form.  I infiltrated each big toe 60 mg Xylocaine Marcaine mixture sterile prep done and using sterile instrumentation removed the hallux nails exposed matrix applied phenol 5 applications 30 seconds followed by alcohol lavage sterile dressing gave instructions on soaks and to wear dressings 24 hours take them off earlier if throbbing were to occur and encouraged to call with questions concerns which may arise

## 2023-11-19 DIAGNOSIS — G4733 Obstructive sleep apnea (adult) (pediatric): Secondary | ICD-10-CM | POA: Diagnosis not present

## 2023-12-20 DIAGNOSIS — G4733 Obstructive sleep apnea (adult) (pediatric): Secondary | ICD-10-CM | POA: Diagnosis not present

## 2023-12-22 DIAGNOSIS — S46911A Strain of unspecified muscle, fascia and tendon at shoulder and upper arm level, right arm, initial encounter: Secondary | ICD-10-CM | POA: Diagnosis not present

## 2024-03-23 ENCOUNTER — Encounter (HOSPITAL_BASED_OUTPATIENT_CLINIC_OR_DEPARTMENT_OTHER): Payer: Self-pay

## 2024-03-23 ENCOUNTER — Emergency Department (HOSPITAL_BASED_OUTPATIENT_CLINIC_OR_DEPARTMENT_OTHER)
Admission: EM | Admit: 2024-03-23 | Discharge: 2024-03-23 | Discharge status: Against Medical Advice | Attending: Emergency Medicine | Admitting: Emergency Medicine

## 2024-03-23 ENCOUNTER — Other Ambulatory Visit: Payer: Self-pay

## 2024-03-23 DIAGNOSIS — R42 Dizziness and giddiness: Secondary | ICD-10-CM | POA: Diagnosis present

## 2024-03-23 DIAGNOSIS — Z5321 Procedure and treatment not carried out due to patient leaving prior to being seen by health care provider: Secondary | ICD-10-CM | POA: Insufficient documentation

## 2024-03-23 LAB — COMPREHENSIVE METABOLIC PANEL WITH GFR
ALT: 20 U/L (ref 0–44)
AST: 25 U/L (ref 15–41)
Albumin: 4.2 g/dL (ref 3.5–5.0)
Alkaline Phosphatase: 106 U/L (ref 38–126)
Anion gap: 12 (ref 5–15)
BUN: 26 mg/dL — ABNORMAL HIGH (ref 8–23)
CO2: 23 mmol/L (ref 22–32)
Calcium: 9.4 mg/dL (ref 8.9–10.3)
Chloride: 103 mmol/L (ref 98–111)
Creatinine, Ser: 1.72 mg/dL — ABNORMAL HIGH (ref 0.61–1.24)
GFR, Estimated: 43 mL/min — ABNORMAL LOW (ref >60)
Glucose, Bld: 67 mg/dL — ABNORMAL LOW (ref 70–99)
Potassium: 4.1 mmol/L (ref 3.5–5.1)
Sodium: 138 mmol/L (ref 135–145)
Total Bilirubin: 0.3 mg/dL (ref 0.0–1.2)
Total Protein: 7 g/dL (ref 6.5–8.1)

## 2024-03-23 LAB — CBC WITH DIFFERENTIAL/PLATELET
Abs Immature Granulocytes: 0.01 K/uL (ref 0.00–0.07)
Basophils Absolute: 0.1 K/uL (ref 0.0–0.1)
Basophils Relative: 1 %
Eosinophils Absolute: 0.4 K/uL (ref 0.0–0.5)
Eosinophils Relative: 5 %
HCT: 43.5 % (ref 39.0–52.0)
Hemoglobin: 14.3 g/dL (ref 13.0–17.0)
Immature Granulocytes: 0 %
Lymphocytes Relative: 32 %
Lymphs Abs: 2.2 K/uL (ref 0.7–4.0)
MCH: 27.6 pg (ref 26.0–34.0)
MCHC: 32.9 g/dL (ref 30.0–36.0)
MCV: 84 fL (ref 80.0–100.0)
Monocytes Absolute: 0.7 K/uL (ref 0.1–1.0)
Monocytes Relative: 10 %
Neutro Abs: 3.5 K/uL (ref 1.7–7.7)
Neutrophils Relative %: 52 %
Platelets: 226 K/uL (ref 150–400)
RBC: 5.18 MIL/uL (ref 4.22–5.81)
RDW: 13.3 % (ref 11.5–15.5)
WBC: 6.8 K/uL (ref 4.0–10.5)
nRBC: 0 % (ref 0.0–0.2)

## 2024-03-23 LAB — URINALYSIS, ROUTINE W REFLEX MICROSCOPIC
Bilirubin Urine: NEGATIVE
Glucose, UA: NEGATIVE mg/dL
Hgb urine dipstick: NEGATIVE
Ketones, ur: NEGATIVE mg/dL
Leukocytes,Ua: NEGATIVE
Nitrite: NEGATIVE
Protein, ur: NEGATIVE mg/dL
Specific Gravity, Urine: 1.013 (ref 1.005–1.030)
pH: 5.5 (ref 5.0–8.0)

## 2024-03-23 NOTE — ED Triage Notes (Signed)
 Pt c/o possible dehydration- I went running 4mi this morning, felt like I was going to pass out. States he's been hydrating since, still feel dizzy.

## 2024-03-24 ENCOUNTER — Emergency Department (HOSPITAL_BASED_OUTPATIENT_CLINIC_OR_DEPARTMENT_OTHER)
Admission: EM | Admit: 2024-03-24 | Discharge: 2024-03-24 | Disposition: A | Discharge status: Home / Self Care | Attending: Emergency Medicine | Admitting: Emergency Medicine

## 2024-03-24 ENCOUNTER — Encounter (HOSPITAL_BASED_OUTPATIENT_CLINIC_OR_DEPARTMENT_OTHER): Payer: Self-pay

## 2024-03-24 ENCOUNTER — Emergency Department (HOSPITAL_BASED_OUTPATIENT_CLINIC_OR_DEPARTMENT_OTHER): Admitting: Radiology

## 2024-03-24 ENCOUNTER — Other Ambulatory Visit: Payer: Self-pay

## 2024-03-24 DIAGNOSIS — T675XXA Heat exhaustion, unspecified, initial encounter: Secondary | ICD-10-CM | POA: Insufficient documentation

## 2024-03-24 DIAGNOSIS — X30XXXA Exposure to excessive natural heat, initial encounter: Secondary | ICD-10-CM | POA: Diagnosis not present

## 2024-03-24 DIAGNOSIS — R001 Bradycardia, unspecified: Secondary | ICD-10-CM | POA: Diagnosis not present

## 2024-03-24 DIAGNOSIS — R42 Dizziness and giddiness: Secondary | ICD-10-CM | POA: Insufficient documentation

## 2024-03-24 DIAGNOSIS — R55 Syncope and collapse: Secondary | ICD-10-CM | POA: Diagnosis not present

## 2024-03-24 DIAGNOSIS — E860 Dehydration: Secondary | ICD-10-CM | POA: Diagnosis not present

## 2024-03-24 DIAGNOSIS — I700 Atherosclerosis of aorta: Secondary | ICD-10-CM | POA: Diagnosis not present

## 2024-03-24 LAB — CBC
HCT: 43.3 % (ref 39.0–52.0)
Hemoglobin: 14.4 g/dL (ref 13.0–17.0)
MCH: 27.9 pg (ref 26.0–34.0)
MCHC: 33.3 g/dL (ref 30.0–36.0)
MCV: 83.8 fL (ref 80.0–100.0)
Platelets: 223 K/uL (ref 150–400)
RBC: 5.17 MIL/uL (ref 4.22–5.81)
RDW: 13.3 % (ref 11.5–15.5)
WBC: 6 K/uL (ref 4.0–10.5)
nRBC: 0 % (ref 0.0–0.2)

## 2024-03-24 LAB — COMPREHENSIVE METABOLIC PANEL WITH GFR
ALT: 22 U/L (ref 0–44)
AST: 26 U/L (ref 15–41)
Albumin: 4.3 g/dL (ref 3.5–5.0)
Alkaline Phosphatase: 100 U/L (ref 38–126)
Anion gap: 12 (ref 5–15)
BUN: 22 mg/dL (ref 8–23)
CO2: 24 mmol/L (ref 22–32)
Calcium: 9.8 mg/dL (ref 8.9–10.3)
Chloride: 102 mmol/L (ref 98–111)
Creatinine, Ser: 1.46 mg/dL — ABNORMAL HIGH (ref 0.61–1.24)
GFR, Estimated: 53 mL/min — ABNORMAL LOW (ref >60)
Glucose, Bld: 131 mg/dL — ABNORMAL HIGH (ref 70–99)
Potassium: 4.4 mmol/L (ref 3.5–5.1)
Sodium: 137 mmol/L (ref 135–145)
Total Bilirubin: 0.5 mg/dL (ref 0.0–1.2)
Total Protein: 7 g/dL (ref 6.5–8.1)

## 2024-03-24 LAB — URINALYSIS, ROUTINE W REFLEX MICROSCOPIC
Bilirubin Urine: NEGATIVE
Glucose, UA: NEGATIVE mg/dL
Hgb urine dipstick: NEGATIVE
Ketones, ur: NEGATIVE mg/dL
Leukocytes,Ua: NEGATIVE
Nitrite: NEGATIVE
Protein, ur: NEGATIVE mg/dL
Specific Gravity, Urine: 1.015 (ref 1.005–1.030)
pH: 6 (ref 5.0–8.0)

## 2024-03-24 LAB — TROPONIN T, HIGH SENSITIVITY: Troponin T High Sensitivity: <15 ng/L (ref <19)

## 2024-03-24 LAB — MAGNESIUM: Magnesium: 2.1 mg/dL (ref 1.7–2.4)

## 2024-03-24 LAB — CK: Total CK: 124 U/L (ref 49–397)

## 2024-03-24 NOTE — ED Provider Notes (Signed)
 [place] EMERGENCY DEPARTMENT AT [facility] Provider Note   CSN: [id number] Arrival date & time: 03/24/24  9354     Patient presents with: Dizziness and Dehydration   Ruben Leonard is a 68 y.o. male here with lightheadedness. Patient reports he ran 4 miles in the heat yesterday and was feeling very lightheaded afterwards, with a headache.  He drank lots of water.  He came to ED but had to leave to take care of his mother, who has dementia.  He feels better today but returns to get checked out.  Denies chest pain, SOB, syncope, but reports near syncope yesterday. He runs 4-5 times per week but feels it was too hot yesterday.  No muscle aches, fevers, cough, sore throat.   HPI     Prior to Admission medications   Medication Sig Start Date End Date Taking? Authorizing Provider  albuterol  (VENTOLIN  HFA) 108 (90 Base) MCG/ACT inhaler Inhale 1-2 puffs into the lungs every 6 (six) hours as needed for wheezing or shortness of breath. 01/14/21   Parrett, Tammy S, NP  atorvastatin (LIPITOR) 20 MG tablet Take 20 mg by mouth daily.    [provider]  fluticasone (FLONASE) 50 MCG/ACT nasal spray as needed.    [provider]  losartan (COZAAR) 50 MG tablet Take 50 mg by mouth daily. 07/08/16   [provider]  Omeprazole Magnesium (PRILOSEC) 10 MG PACK Take 10 mg by mouth as needed.    [provider]    Allergies: Patient has no known allergies.    Review of Systems  Updated Vital Signs BP (!) 145/74   Pulse (!) 48   Temp 98.5 F (36.9 C)   Resp 10   Ht 5' 10 (1.778 m)   Wt 87.5 kg   SpO2 98%   BMI 27.69 kg/m   Physical Exam Constitutional:      General: He is not in acute distress. HENT:     Head: Normocephalic and atraumatic.  Eyes:     Conjunctiva/sclera: Conjunctivae normal.     Pupils: Pupils are equal, round, and reactive to light.  Cardiovascular:     Rate and Rhythm: Normal rate and regular rhythm.  Pulmonary:      Effort: Pulmonary effort is normal. No respiratory distress.  Abdominal:     General: There is no distension.     Tenderness: There is no abdominal tenderness.  Skin:    General: Skin is warm and dry.  Neurological:     General: No focal deficit present.     Mental Status: He is alert. Mental status is at baseline.  Psychiatric:        Mood and Affect: Mood normal.        Behavior: Behavior normal.     (all labs ordered are listed, but only abnormal results are displayed) Labs Reviewed  COMPREHENSIVE METABOLIC PANEL WITH GFR - Abnormal; Notable for the following components:      Result Value   Glucose, Bld 131 (*)    Creatinine, Ser 1.46 (*)    GFR, Estimated 53 (*)    All other components within normal limits  CBC  URINALYSIS, ROUTINE W REFLEX MICROSCOPIC  CK  MAGNESIUM  TROPONIN T, HIGH SENSITIVITY    EKG: EKG Interpretation Date/Time:  Friday March 24 2024 06:55:07 EDT Ventricular Rate:  51 PR Interval:  216 QRS Duration:  97 QT Interval:  443 QTC Calculation: 408 R Axis:   62  Text Interpretation: Sinus rhythm Borderline  prolonged PR interval Confirmed by Cottie Cough [phone]) on 03/24/2024 7:00:48 AM  Radiology: DG Chest 2 View Result Date: 03/24/2024 EXAM: 2 VIEW(S) XRAY OF THE CHEST 03/24/2024 07:28:56 AM COMPARISON: None available. CLINICAL HISTORY: Near syncope. Dizziness; Dehydration; near syncope during run yesterday. FINDINGS: LUNGS AND PLEURA: No focal pulmonary opacity. No pulmonary edema. No pleural effusion. No pneumothorax. HEART AND MEDIASTINUM: No acute abnormality of the cardiac and mediastinal silhouettes. Atheromatous aorta. BONES AND SOFT TISSUES: No acute osseous abnormality. Surgical clips right upper abdomen. IMPRESSION: 1. No acute process. Electronically signed by: Katheleen Faes MD 03/24/2024 07:39 AM EDT RP Workstation: [id number]     Procedures   Medications Ordered in the ED - No data to display  Clinical Course as of 03/24/24 0803   [facility] Mar 24, 2024  9255 Prior ecgs from 2022 and 2023 show history of sinus bradycardia [MT]    Clinical Course User Index [MT] Wilberth Damon, Cough PARAS, MD                                 Medical Decision Making Amount and/or Complexity of Data Reviewed Labs: ordered. Radiology: ordered.   This patient presents to the ED with concern for near syncope. This involves an extensive number of treatment options, and is a complaint that carries with it a high risk of complications and morbidity.  The differential diagnosis includes heat exhaustion vs dehydration vs arrhythmia vs other  I ordered and personally interpreted labs.  The pertinent results include:  no emergent findings.  CKD stable near baseline levels; however Cr and Bun both improved from levels yesterday in Ed triage, suggestive likely of some dehydration component.  He was borderline hypoglycemic yesterday but no longer today  I ordered imaging studies including dg chest I independently visualized and interpreted imaging which showed no emergent findings I agree with the radiologist interpretation  The patient was maintained on a cardiac monitor.  I personally viewed and interpreted the cardiac monitored which showed an underlying rhythm of: sinus rhythm and sinus bradycardia (HR 50's).  Suspect this is physiological for his fitness level and age.  Per my interpretation the patient's ECG shows borderline sinus bradycardia, no acute ischemia  I have reviewed the patients home medicines and have made adjustments as needed  Test Considered: doubt acute PE, no risk factors, no hypoxia or tachycardia; doubt ACS; doubt sepsis, ICH, CVA, meningitis   Disposiition:  After consideration of the diagnostic results and the patient's response to treatment, I feel that the patient would benefit from rest, cooling, and hydration at home.   We discussed avoiding running in high heat - and not to overexert himself physically over the next 3  days.        Final diagnoses:  Heat exhaustion, initial encounter  Sinus bradycardia    ED Discharge Orders     None          Sydnee Lamour, Cough PARAS, MD 03/24/24 [id number]

## 2024-03-24 NOTE — ED Notes (Signed)
 DC paperwork given and verbally understood.

## 2024-03-24 NOTE — ED Triage Notes (Signed)
 Pt to ED for dehydration states he went for a run yesterday and after he felt like he was going to pass out states he has been drinking fluids since but still feels dizzy. 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 Denies CP/SOB, blurred vision, HA at this time.

## 2024-04-04 DIAGNOSIS — N1831 Chronic kidney disease, stage 3a: Secondary | ICD-10-CM | POA: Diagnosis not present

## 2024-04-04 DIAGNOSIS — R42 Dizziness and giddiness: Secondary | ICD-10-CM | POA: Diagnosis not present

## 2024-04-04 DIAGNOSIS — H8110 Benign paroxysmal vertigo, unspecified ear: Secondary | ICD-10-CM | POA: Diagnosis not present

## 2024-04-04 DIAGNOSIS — I129 Hypertensive chronic kidney disease with stage 1 through stage 4 chronic kidney disease, or unspecified chronic kidney disease: Secondary | ICD-10-CM | POA: Diagnosis not present

## 2024-04-04 DIAGNOSIS — G4733 Obstructive sleep apnea (adult) (pediatric): Secondary | ICD-10-CM | POA: Diagnosis not present

## 2024-06-26 ENCOUNTER — Ambulatory Visit: Admitting: Cardiology

## 2024-07-07 DIAGNOSIS — J309 Allergic rhinitis, unspecified: Secondary | ICD-10-CM | POA: Diagnosis not present

## 2024-07-07 DIAGNOSIS — J342 Deviated nasal septum: Secondary | ICD-10-CM | POA: Diagnosis not present

## 2024-07-26 ENCOUNTER — Ambulatory Visit: Attending: Cardiology | Admitting: Cardiology

## 2024-07-26 ENCOUNTER — Encounter: Payer: Self-pay | Admitting: Cardiology

## 2024-07-26 VITALS — BP 151/73 | HR 55 | Ht 70.0 in | Wt 192.0 lb

## 2024-07-26 DIAGNOSIS — E782 Mixed hyperlipidemia: Secondary | ICD-10-CM | POA: Diagnosis not present

## 2024-07-26 DIAGNOSIS — I10 Essential (primary) hypertension: Secondary | ICD-10-CM | POA: Diagnosis not present

## 2024-07-26 DIAGNOSIS — R42 Dizziness and giddiness: Secondary | ICD-10-CM | POA: Insufficient documentation

## 2024-07-26 MED ORDER — ATORVASTATIN CALCIUM 40 MG PO TABS: 40.0000 mg | ORAL_TABLET | Freq: Every day | ORAL | 3 refills | Status: AC

## 2024-07-26 NOTE — Patient Instructions (Signed)
 Medication Instructions:  INCREASE Atorvastatin to 40 mg   *If you need a refill on your cardiac medications before your next appointment, please call your pharmacy*  Lab Work: Lipid panel in 10/2024  If you have labs (blood work) drawn today and your tests are completely normal, you will receive your results only by: MyChart Message (if you have MyChart) OR A paper copy in the mail If you have any lab test that is abnormal or we need to change your treatment, we will call you to review the results.  Testing/Procedures: Echocardiogram   Your physician has requested that you have an echocardiogram. Echocardiography is a painless test that uses sound waves to create images of your heart. It provides your doctor with information about the size and shape of your heart and how well your heart's chambers and valves are working. This procedure takes approximately one hour. There are no restrictions for this procedure. Please do NOT wear cologne, perfume, aftershave, or lotions (deodorant is allowed). Please arrive 15 minutes prior to your appointment time.  Please note: We ask at that you not bring children with you during ultrasound (echo/ vascular) testing. Due to room size and safety concerns, children are not allowed in the ultrasound rooms during exams. Our front office staff cannot provide observation of children in our lobby area while testing is being conducted. An adult accompanying a patient to their appointment will only be allowed in the ultrasound room at the discretion of the ultrasound technician under special circumstances. We apologize for any inconvenience.   Follow-Up: At [facility], you and your health needs are our priority.  As part of our continuing mission to provide you with exceptional heart care, our providers are all part of one team.  This team includes your primary Cardiologist (physician) and Advanced Practice Providers or APPs (Physician Assistants and Nurse  Practitioners) who all work together to provide you with the care you need, when you need it.  Your next appointment:   11/2024   Provider:   One of our Advanced Practice Providers (APPs): Morse Clause, PA-C  Lamarr Satterfield, NP Miriam Shams, NP  Olivia Pavy, PA-C Josefa Beauvais, NP  Leontine Salen, PA-C Orren Fabry, PA-C  Butler, PA-C Ernest Dick, NP  Damien Braver, NP Jon Hails, PA-C  Waddell Donath, PA-C    Dayna Dunn, PA-C  Scott Weaver, PA-C Lum Louis, NP Katlyn West, NP Callie Goodrich, PA-C  Xika Zhao, NP Sheng Haley, PA-C    Kathleen Johnson, PA-C

## 2024-07-26 NOTE — Progress Notes (Signed)
 Cardiology Office Note:  .   Date:  07/26/2024  ID:  TUFF CLABO, DOB 30-Nov-1955, MRN [id number] PCP: Vernadine Charlie ORN, MD  [facility] Providers Cardiologist:  Newman Lawrence, MD PCP: Vernadine Charlie ORN, MD  Chief Complaint  Patient presents with   Dizziness     Ruben Leonard is a 68 y.o. male with hypertension, hyperlipidemia, dizziness  Discussed the use of AI scribe software for clinical note transcription with the patient, who gave verbal consent to proceed.  History of Present Illness Ruben Leonard is a 68 year old male with hypertension and hyperlipidemia who presents with dizziness and near syncope during exercise. He was referred by his family doctor for evaluation of a possible cardiac issue after an episode of dizziness and near syncope.  In July or August, he had a single episode of dizziness and near syncope while running in high heat and humidity. He felt very dizzy, sat down, did not lose consciousness, and went to the ER where vitals were normal. He has had no recurrent dizziness, syncope, chest pain, or dyspnea since.  He has hypertension on losartan 50 mg daily with home blood pressures about 120-135/70-80 mmHg, including 130/70 mmHg the night before the visit.  He takes atorvastatin 20 mg for hyperlipidemia. His May lipid panel showed LDL 138 mg/dL.  He donated a kidney to his father 20 years ago and has one kidney with baseline creatinine 1.46 mg/dL.  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 He does not currently smoke or drink. He denies other cardiac symptoms.      Vitals:   07/26/24 0858  BP: (!) 151/73  Pulse: (!) 55  SpO2: 98%   Orthostatic VS for the past 72 hrs (Last 3 readings):  Orthostatic BP Patient Position BP Location Cuff Size Orthostatic Pulse  07/26/24 0907 163/90 Standing Right Arm Large 58  07/26/24 0905 151/73 Sitting Right Arm Normal 59  07/26/24 0904 157/77  Supine Right Arm Normal 58      Review of Systems  Cardiovascular:  Negative for chest pain, dyspnea on exertion, leg swelling, palpitations and syncope.        Studies Reviewed: SABRA        EKG 07/26/2024: Sinus bradycardia with 1st degree A-V block Nonspecific T wave abnormality When compared with ECG of 24-Mar-2024 06:55, No significant change was found    Labs 12/2023: Chol 196, TG 76, HDL 43, LDL 138 Hb 14.4 Cr 1.46  2023: TSH 1.2   Physical Exam Vitals and nursing note reviewed.  Constitutional:      General: He is not in acute distress. Neck:     Vascular: No JVD.  Cardiovascular:     Rate and Rhythm: Normal rate and regular rhythm.     Heart sounds: Normal heart sounds. No murmur heard. Pulmonary:     Effort: Pulmonary effort is normal.     Breath sounds: Normal breath sounds. No wheezing or rales.  Musculoskeletal:     Right lower leg: No edema.     Left lower leg: No edema.      VISIT DIAGNOSES:   ICD-10-CM   1. Dizziness  R42 EKG 12-Lead    2. Primary hypertension  I10 ECHOCARDIOGRAM COMPLETE    3. Mixed hyperlipidemia  E78.2 Lipid panel       Ruben Leonard is a 68 y.o. male with hypertension, hyperlipidemia, dizziness Assessment & Plan Dizziness: Episode of dizziness while  running on a hot muggy morning likely related to dehydration/orthostasis.  No recurrence since then. Recommended liberal hydration, especially given that patient is an avid runner.  Primary hypertension: Blood pressure elevated today, but generally well-controlled at home. Continue losartan 50 mg daily for now.  I encouraged the patient to send a MyChart message with regular blood pressure readings over the next 1 week to decide if we need further uptitration of antihypertensive therapy.  Mixed hyperlipidemia: LDL cholesterol elevated at 138 mg/dL on atorvastatin 20 mg.  We discussed obtaining a calcium score scan for restratification, but patient would like to hold  off for now. Regardless, increase atorvastatin to 40 mg daily.  Repeat lipid panel in 3 months. Goal LDL <100 in absence of any known CAD.            No orders of the defined types were placed in this encounter.    F/u in 3 months  Signed, Newman JINNY Lawrence, MD

## 2024-09-26 ENCOUNTER — Telehealth (HOSPITAL_COMMUNITY): Payer: Self-pay | Admitting: Cardiology

## 2024-09-26 NOTE — Telephone Encounter (Signed)
 Patient cancelled the scheduled echocardiogram on 09/27/24. He did not wish to reschedule at this time. Order will be removed from the active echo WQ. Thank you.

## 2024-09-27 ENCOUNTER — Ambulatory Visit (HOSPITAL_COMMUNITY)

## 2024-11-06 ENCOUNTER — Ambulatory Visit (HOSPITAL_BASED_OUTPATIENT_CLINIC_OR_DEPARTMENT_OTHER): Admitting: Pulmonary Disease

## 2024-12-06 ENCOUNTER — Ambulatory Visit: Admitting: Physician Assistant
